# Patient Record
Sex: Female | Born: 1965
Health system: Southern US, Community
[De-identification: ages and names within clinical notes are randomized; demographics above are authoritative.]

## PROBLEM LIST (undated history)

## (undated) DIAGNOSIS — N200 Calculus of kidney: Secondary | ICD-10-CM

---

## 2009-10-29 ENCOUNTER — Emergency Department (HOSPITAL_COMMUNITY): Admission: EM | Admit: 2009-10-29 | Discharge: 2009-10-29 | Payer: Self-pay | Admitting: Emergency Medicine

## 2011-03-23 LAB — URINALYSIS, ROUTINE W REFLEX MICROSCOPIC
Bilirubin Urine: NEGATIVE
Glucose, UA: NEGATIVE mg/dL
Ketones, ur: NEGATIVE mg/dL
Nitrite: NEGATIVE
Protein, ur: NEGATIVE mg/dL
Specific Gravity, Urine: 1.029 (ref 1.005–1.030)
Urobilinogen, UA: 0.2 mg/dL (ref 0.0–1.0)
pH: 5.5 (ref 5.0–8.0)

## 2011-03-23 LAB — URINE MICROSCOPIC-ADD ON

## 2011-03-23 LAB — COMPREHENSIVE METABOLIC PANEL
ALT: 15 U/L (ref 0–35)
AST: 17 U/L (ref 0–37)
Albumin: 4 g/dL (ref 3.5–5.2)
Calcium: 9.3 mg/dL (ref 8.4–10.5)
Chloride: 105 mEq/L (ref 96–112)
Creatinine, Ser: 0.92 mg/dL (ref 0.4–1.2)
GFR calc Af Amer: >60 mL/min (ref >60)
Sodium: 137 mEq/L (ref 135–145)
Total Bilirubin: 0.6 mg/dL (ref 0.3–1.2)

## 2011-03-23 LAB — DIFFERENTIAL
Basophils Absolute: 0 K/uL (ref 0.0–0.1)
Basophils Relative: 1 % (ref 0–1)
Eosinophils Absolute: 0.1 K/uL (ref 0.0–0.7)
Eosinophils Relative: 2 % (ref 0–5)
Lymphocytes Relative: 20 % (ref 12–46)
Lymphs Abs: 1.4 K/uL (ref 0.7–4.0)
Monocytes Absolute: 0.1 K/uL (ref 0.1–1.0)
Monocytes Relative: 2 % — ABNORMAL LOW (ref 3–12)
Neutro Abs: 5.1 K/uL (ref 1.7–7.7)
Neutrophils Relative %: 76 % (ref 43–77)

## 2011-03-23 LAB — RAPID URINE DRUG SCREEN, HOSP PERFORMED
Amphetamines: NOT DETECTED
Barbiturates: NOT DETECTED
Benzodiazepines: NOT DETECTED
Cocaine: NOT DETECTED
Opiates: NOT DETECTED
Tetrahydrocannabinol: POSITIVE — AB

## 2011-03-23 LAB — CBC
HCT: 37 % (ref 36.0–46.0)
Platelets: 270 10*3/uL (ref 150–400)
RBC: 3.92 MIL/uL (ref 3.87–5.11)
WBC: 6.8 10*3/uL (ref 4.0–10.5)

## 2011-03-23 LAB — CK TOTAL AND CKMB (NOT AT ARMC)
CK, MB: 1.1 ng/mL (ref 0.3–4.0)
Relative Index: 1 (ref 0.0–2.5)
Total CK: 105 U/L (ref 7–177)

## 2011-03-23 LAB — TROPONIN I

## 2012-10-30 ENCOUNTER — Emergency Department (INDEPENDENT_AMBULATORY_CARE_PROVIDER_SITE_OTHER)
Admission: EM | Admit: 2012-10-30 | Discharge: 2012-10-30 | Disposition: A | Discharge status: Hospice | Payer: 59 | Source: Home / Self Care | Attending: Emergency Medicine | Admitting: Emergency Medicine

## 2012-10-30 ENCOUNTER — Encounter (HOSPITAL_COMMUNITY): Payer: Self-pay | Admitting: Emergency Medicine

## 2012-10-30 ENCOUNTER — Observation Stay (HOSPITAL_COMMUNITY): Payer: Self-pay

## 2012-10-30 ENCOUNTER — Observation Stay (HOSPITAL_COMMUNITY)
Admission: EM | Admit: 2012-10-30 | Discharge: 2012-10-31 | Disposition: A | Discharge status: Home / Self Care | Payer: 59 | Attending: Emergency Medicine | Admitting: Emergency Medicine

## 2012-10-30 DIAGNOSIS — R200 Anesthesia of skin: Secondary | ICD-10-CM

## 2012-10-30 DIAGNOSIS — R209 Unspecified disturbances of skin sensation: Principal | ICD-10-CM | POA: Insufficient documentation

## 2012-10-30 DIAGNOSIS — F17200 Nicotine dependence, unspecified, uncomplicated: Secondary | ICD-10-CM | POA: Insufficient documentation

## 2012-10-30 DIAGNOSIS — R202 Paresthesia of skin: Secondary | ICD-10-CM

## 2012-10-30 LAB — CBC
MCHC: 34.7 g/dL (ref 30.0–36.0)
RDW: 13.2 % (ref 11.5–15.5)
WBC: 6 10*3/uL (ref 4.0–10.5)

## 2012-10-30 LAB — POCT URINALYSIS DIP (DEVICE)
Ketones, ur: NEGATIVE mg/dL
Protein, ur: NEGATIVE mg/dL
Specific Gravity, Urine: <=1.005 (ref 1.005–1.030)
pH: 7 (ref 5.0–8.0)

## 2012-10-30 MED ORDER — DIAZEPAM 5 MG/ML IJ SOLN
5.0000 mg | Freq: Three times a day (TID) | INTRAMUSCULAR | Status: DC | PRN
  Administered 2012-10-31: 5 mg via INTRAVENOUS
  Filled 2012-10-30: qty 2

## 2012-10-30 NOTE — ED Provider Notes (Signed)
History     CSN: [id number]  Arrival date & time 10/30/12  2027   First MD Initiated Contact with Patient 10/30/12 2254      Chief Complaint  Patient presents with  . Numbness    (Consider location/radiation/quality/duration/timing/severity/associated sxs/prior treatment) HPI Hx per PT. L sided numbness onset 4pm lasted about 2 hours. HAs had this before. Today had to leave work for these symptoms, some associated HA. Took ibuprofen, became more worried tonight and presents for evaluation. No F/C, no neck pain.  No weakness, no trouble with gait or speech. No h/o MS. No recent illness. No known agrevating or alleviating factors. HAs been told her BP was elevated in the past but has never been treated. No h/o HLD or DM. No PCP. Went to UC and sent here for further evaluation.   History reviewed. No pertinent past medical history.  Past Surgical History  Procedure Date  . Cesarean section     Family History  Problem Relation Age of Onset  . Stroke Mother     History  Substance Use Topics  . Smoking status: Current Some Day Smoker  . Smokeless tobacco: Not on file  . Alcohol Use: Yes    OB History    Grav Para Term Preterm Abortions TAB SAB Ect Mult Living                  Review of Systems  Constitutional: Negative for fever and chills.  HENT: Negative for neck pain and neck stiffness.   Eyes: Negative for pain.  Respiratory: Negative for shortness of breath.   Cardiovascular: Negative for chest pain.  Gastrointestinal: Negative for vomiting and abdominal pain.  Genitourinary: Negative for dysuria.  Musculoskeletal: Negative for back pain.  Skin: Negative for rash.  Neurological: Positive for numbness. Negative for dizziness.  All other systems reviewed and are negative.    Allergies  Review of patient's allergies indicates no known allergies.  Home Medications   Current Outpatient Rx  Name  Route  Sig  Dispense  Refill  . VICODIN PO   Oral   Take 0.5  tablets by mouth once. For kidney stone pain         . IBUPROFEN 200 MG PO TABS   Oral   Take 400 mg by mouth every 6 (six) hours as needed. For pain           BP 155/89  Pulse 75  Temp 98.2 F (36.8 C) (Oral)  Resp 18  SpO2 100%  LMP 10/22/2012  Physical Exam  Constitutional: She is oriented to person, place, and time. She appears well-developed and well-nourished.  HENT:  Head: Normocephalic and atraumatic.  Eyes: Conjunctivae normal and EOM are normal. Pupils are equal, round, and reactive to light.  Neck: Full passive range of motion without pain. Neck supple. No thyromegaly present.       No meningismus, no cervical spine tenderness  Cardiovascular: Normal rate, regular rhythm, S1 normal, S2 normal and intact distal pulses.   Pulmonary/Chest: Effort normal and breath sounds normal.  Abdominal: Soft. Bowel sounds are normal. There is no tenderness. There is no CVA tenderness.  Musculoskeletal: Normal range of motion.  Neurological: She is alert and oriented to person, place, and time. She has normal strength and normal reflexes. No cranial nerve deficit or sensory deficit. She displays a negative Romberg sign. GCS eye subscore is 4. GCS verbal subscore is 5. GCS motor subscore is 6.       Normal  Gait  Skin: Skin is warm and dry. No rash noted. No cyanosis. Nails show no clubbing.  Psychiatric: She has a normal mood and affect. Her speech is normal and behavior is normal.    ED Course  Procedures (including critical care time)   ABCD2 score = 3 for BP and duration of symptoms   Date: 10/30/2012  Rate: 66  Rhythm: normal sinus rhythm  QRS Axis: normal  Intervals: normal  ST/T Wave abnormalities: nonspecific ST changes  Conduction Disutrbances:none  Narrative Interpretation:   Old EKG Reviewed: none available  Results for orders placed during the hospital encounter of 10/30/12  CBC      Component Value Range   WBC 6.0  4.0 - 10.5 K/uL   RBC 3.82 (*) 3.87 -  5.11 MIL/uL   Hemoglobin 11.7 (*) 12.0 - 15.0 g/dL   HCT 16.1 (*) 09.6 - 04.5 %   MCV 88.2  78.0 - 100.0 fL   MCH 30.6  26.0 - 34.0 pg   MCHC 34.7  30.0 - 36.0 g/dL   RDW 40.9  81.1 - 91.4 %   Platelets 267  150 - 400 K/uL  COMPREHENSIVE METABOLIC PANEL      Component Value Range   Sodium 135  135 - 145 mEq/L   Potassium 3.6  3.5 - 5.1 mEq/L   Chloride 102  96 - 112 mEq/L   CO2 22  19 - 32 mEq/L   Glucose, Bld 91  70 - 99 mg/dL   BUN 4 (*) 6 - 23 mg/dL   Creatinine, Ser 7.82  0.50 - 1.10 mg/dL   Calcium 9.1  8.4 - 95.6 mg/dL   Total Protein 7.0  6.0 - 8.3 g/dL   Albumin 3.6  3.5 - 5.2 g/dL   AST 17  0 - 37 U/L   ALT 10  0 - 35 U/L   Alkaline Phosphatase 58  39 - 117 U/L   Total Bilirubin 0.3  0.3 - 1.2 mg/dL   GFR calc non Af Amer >90  >90 mL/min   GFR calc Af Amer >90  >90 mL/min  PROTIME-INR      Component Value Range   Prothrombin Time 13.7  11.6 - 15.2 seconds   INR 1.06  0.00 - 1.49  APTT      Component Value Range   aPTT 33  24 - 37 seconds  URINALYSIS, ROUTINE W REFLEX MICROSCOPIC      Component Value Range   Color, Urine YELLOW  YELLOW   APPearance CLEAR  CLEAR   Specific Gravity, Urine 1.004 (*) 1.005 - 1.030   pH 6.5  5.0 - 8.0   Glucose, UA NEGATIVE  NEGATIVE mg/dL   Hgb urine dipstick TRACE (*) NEGATIVE   Bilirubin Urine NEGATIVE  NEGATIVE   Ketones, ur 15 (*) NEGATIVE mg/dL   Protein, ur NEGATIVE  NEGATIVE mg/dL   Urobilinogen, UA 0.2  0.0 - 1.0 mg/dL   Nitrite NEGATIVE  NEGATIVE   Leukocytes, UA NEGATIVE  NEGATIVE  LIPID PANEL      Component Value Range   Cholesterol 178  0 - 200 mg/dL   Triglycerides 79  <213 mg/dL   HDL 55  >08 mg/dL   Total CHOL/HDL Ratio 3.2     VLDL 16  0 - 40 mg/dL   LDL Cholesterol 657 (*) 0 - 99 mg/dL  URINE MICROSCOPIC-ADD ON      Component Value Range   Squamous Epithelial / LPF RARE  RARE  WBC, UA 0-2  <3 WBC/hpf   RBC / HPF 0-2  <3 RBC/hpf   Ct Head Wo Contrast  10/31/2012  *RADIOLOGY REPORT*  Clinical Data:  Left-sided paralysis.  CT HEAD WITHOUT CONTRAST  Technique:  Contiguous axial images were obtained from the base of the skull through the vertex without contrast.  Comparison: 10/29/2009.  Findings: No acute intracranial abnormality.  Specifically, no hemorrhage, hydrocephalus, mass lesion, acute infarction, or significant intracranial injury.  No acute calvarial abnormality. Visualized paranasal sinuses and mastoids clear.  Orbital soft tissues unremarkable.  IMPRESSION: Unremarkable study.   Original Report Authenticated By: Charlett Nose, M.D.     PO ASA  MDM   L sided numbness now resolved.   Plan TIA OBS protocol MRI in am        Sunnie Nielsen, MD 10/31/12 [phone]

## 2012-10-30 NOTE — ED Notes (Signed)
Pt reports for having L sided numbness for months that has been on and off, started to have episode today, reports felt like had back spasms earlier, and then has numbness to face, reports now has resolved; pt speech clear, no drift, face symmetrical, a&ox4

## 2012-10-30 NOTE — ED Provider Notes (Addendum)
He is an or History     CSN: [id number]  Arrival date & time 10/30/12  1925   First MD Initiated Contact with Patient 10/30/12 1931      Chief Complaint  Patient presents with  . Numbness    (Consider location/radiation/quality/duration/timing/severity/associated sxs/prior treatment) HPI Comments: Patient presents this evening to urgent care complaining of left-sided numbness intermittently that last several hours at a time, she describes that she is unable to feel the left side of her face, left upper extremity  all way down to her left leg. She has been experiencing these symptoms on and off for many months, but have been worsening for the last several weeks.Within the last 2 days she's been also having back pain (LOW) now taking some ibuprofen and some other medication that did not help. She denies any nausea, vomiting, headache visual changes.  Patient is a 46 y.o. female presenting with neurologic complaint. The history is provided by the patient.  Neurologic Problem The primary symptoms include loss of sensation. Primary symptoms do not include headaches, loss of consciousness, altered mental status, seizures, dizziness, visual change, speech change, fever, nausea or vomiting. The symptoms began more than 1 week ago. The symptoms are waxing and waning. The neurological symptoms are focal.  Additional symptoms include pain. Additional symptoms do not include neck stiffness, weakness, lower back pain, leg pain, loss of balance, taste disturbance, hyperacusis or anxiety. Workup history includes CT scan. Procedure history comments: 2010 negative CT.    History reviewed. No pertinent past medical history.  Past Surgical History  Procedure Date  . Cesarean section     Family History  Problem Relation Age of Onset  . Stroke Mother     History  Substance Use Topics  . Smoking status: Current Some Day Smoker  . Smokeless tobacco: Not on file  . Alcohol Use: Yes    OB History      Grav Para Term Preterm Abortions TAB SAB Ect Mult Living                  Review of Systems  Constitutional: Negative for fever, chills and appetite change.  HENT: Negative for neck stiffness.   Gastrointestinal: Negative for nausea and vomiting.  Musculoskeletal: Negative for back pain, joint swelling, arthralgias and gait problem.  Skin: Negative for rash.  Neurological: Positive for numbness. Negative for dizziness, tremors, speech change, seizures, loss of consciousness, syncope, facial asymmetry, speech difficulty, weakness, headaches and loss of balance.  Psychiatric/Behavioral: Negative for altered mental status.    Allergies  Review of patient's allergies indicates no known allergies.  Home Medications   Current Outpatient Rx  Name  Route  Sig  Dispense  Refill  . VICODIN PO   Oral   Take 0.5 tablets by mouth once. For kidney stone pain         . IBUPROFEN 200 MG PO TABS   Oral   Take 400 mg by mouth every 6 (six) hours as needed. For pain           BP 147/66  Pulse 73  Temp 97.9 F (36.6 C) (Oral)  Resp 18  SpO2 100%  LMP 10/22/2012  Physical Exam  Nursing note and vitals reviewed. Constitutional: She is oriented to person, place, and time. Vital signs are normal. She appears well-developed and well-nourished.  Non-toxic appearance. She does not have a sickly appearance. She does not appear ill. No distress.  Cardiovascular: Normal rate and regular rhythm.  Exam reveals  no gallop and no friction rub.   No murmur heard. Pulmonary/Chest: Effort normal and breath sounds normal.  Neurological: She is alert and oriented to person, place, and time. She has normal strength. She displays no atrophy, no tremor and normal reflexes. A sensory deficit is present. No cranial nerve deficit. She exhibits normal muscle tone. She displays no seizure activity. Coordination and gait normal. GCS eye subscore is 4. GCS verbal subscore is 5. GCS motor subscore is 6.        Patient on sensorial exam seem to be having problems discriminate in tactile sensation on her left forehead area. No obvious signs of facial motor para ounces is seen on exam. Muscular strength of both upper extremities 5 out of 5.  Skin: Skin is warm. No erythema.    ED Course  Procedures (including critical care time)  Labs Reviewed  POCT URINALYSIS DIP (DEVICE) - Abnormal; Notable for the following:    Hgb urine dipstick TRACE (*)     All other components within normal limits  LAB REPORT - SCANNED   Ct Head Wo Contrast  10/31/2012  *RADIOLOGY REPORT*  Clinical Data: Left-sided paralysis.  CT HEAD WITHOUT CONTRAST  Technique:  Contiguous axial images were obtained from the base of the skull through the vertex without contrast.  Comparison: 10/29/2009.  Findings: No acute intracranial abnormality.  Specifically, no hemorrhage, hydrocephalus, mass lesion, acute infarction, or significant intracranial injury.  No acute calvarial abnormality. Visualized paranasal sinuses and mastoids clear.  Orbital soft tissues unremarkable.  IMPRESSION: Unremarkable study.   Original Report Authenticated By: Charlett Nose, M.D.    Mr Brain Wo Contrast  10/31/2012  *RADIOLOGY REPORT*  Clinical Data: Left sided numbness.  Headaches.  MRI HEAD WITHOUT CONTRAST  Technique:  Multiplanar, multiecho pulse sequences of the brain and surrounding structures were obtained according to standard protocol without intravenous contrast.  Comparison: 10/30/2012 and 10/29/2009 CT.  10/29/2009 MR.  Findings: No acute infarct.  No intracranial hemorrhage.  No intracranial mass lesion detected on this unenhanced exam.  No hydrocephalus.  Scattered nonspecific white matter type changes appears similar to the prior examination.  Findings may be related to result of small vessel disease although other considerations such as that secondary to; demyelinating process, vasculitis, inflammatory process, migraine headaches or remote trauma  cannot be completely excluded.  Small although patent right vertebral artery.  This is without change.  Major intracranial vascular structures are patent. Mild ectasia of the left internal carotid artery supraclinoid segment and basilar artery.  Minimal to mild paranasal sinus mucosal thickening.  Cerebellar tonsils minimally low-lying but within range normal limits. Minimal exophthalmos.  IMPRESSION: No acute infarct.  No significant change in the appearance of nonspecific white matter type changes as detailed above.  Minimal to mild paranasal sinus mucosal thickening.   Original Report Authenticated By: Lacy Duverney, M.D.      1. Paresthesia       MDM  Recurrent unilateral paresthesias for months- Patient will be transferred to the ED for furtther evaluation.        Jimmie Molly, MD 10/30/12 2010  Jimmie Molly, MD 10/31/12 1423  Jimmie Molly, MD 10/31/12 [phone]

## 2012-10-30 NOTE — ED Notes (Signed)
Reports left side paralysis for months now on and off.   Patient says it started with back pain.  Patient states she took ibuprofen and another pill that was given to her by a friend and when the medication wore off that's when she reports the left side of her body is numbness.

## 2012-10-30 NOTE — ED Notes (Signed)
Pt with intermittent L sided numbness for several years that is getting worse.  Sometimes it is just her L arm, but other times it is her face, arm and leg. NIH of 0.

## 2012-10-31 ENCOUNTER — Encounter (HOSPITAL_COMMUNITY): Payer: Self-pay | Admitting: Radiology

## 2012-10-31 ENCOUNTER — Observation Stay (HOSPITAL_COMMUNITY): Payer: Self-pay

## 2012-10-31 DIAGNOSIS — G459 Transient cerebral ischemic attack, unspecified: Secondary | ICD-10-CM

## 2012-10-31 LAB — COMPREHENSIVE METABOLIC PANEL
ALT: 10 U/L (ref 0–35)
AST: 17 U/L (ref 0–37)
Albumin: 3.6 g/dL (ref 3.5–5.2)
Alkaline Phosphatase: 58 U/L (ref 39–117)
Chloride: 102 mEq/L (ref 96–112)
Potassium: 3.6 mEq/L (ref 3.5–5.1)
Sodium: 135 mEq/L (ref 135–145)
Total Bilirubin: 0.3 mg/dL (ref 0.3–1.2)
Total Protein: 7 g/dL (ref 6.0–8.3)

## 2012-10-31 LAB — LIPID PANEL
Cholesterol: 178 mg/dL (ref 0–200)
HDL: 55 mg/dL (ref >39)
Total CHOL/HDL Ratio: 3.2 RATIO
Triglycerides: 79 mg/dL (ref <150)

## 2012-10-31 LAB — POCT I-STAT TROPONIN I: Troponin i, poc: 0 ng/mL (ref 0.00–0.08)

## 2012-10-31 LAB — URINALYSIS, ROUTINE W REFLEX MICROSCOPIC
Glucose, UA: NEGATIVE mg/dL
Leukocytes, UA: NEGATIVE
Protein, ur: NEGATIVE mg/dL
Specific Gravity, Urine: 1.004 — ABNORMAL LOW (ref 1.005–1.030)
pH: 6.5 (ref 5.0–8.0)

## 2012-10-31 LAB — HEMOGLOBIN A1C: Mean Plasma Glucose: 97 mg/dL (ref <117)

## 2012-10-31 MED ORDER — ASPIRIN 81 MG PO CHEW
324.0000 mg | CHEWABLE_TABLET | Freq: Once | ORAL | Status: AC
  Administered 2012-10-31: 324 mg via ORAL
  Filled 2012-10-31: qty 4

## 2012-10-31 NOTE — ED Notes (Signed)
PT is medicated with valium and going to MRI now

## 2012-10-31 NOTE — ED Provider Notes (Signed)
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 Patient was signed out to me by Dr. Dierdre Highman.  Denies headache, chest pain, shortness of breath, nausea, vomiting, abdominal pain, constipation, diarrhea, dysuria, back pain, peripheral edema, numbness and tingling of the extremities.  PE: Gen: A&O x4 HEENT: PERRL, EOM CHEST: RRR, no m/r/g LUNGS: CTAB, no w/r/r ABD: BS x 4, ND/NT EXT: No edema, strong peripheral pulses NEURO: Sensation and strength intact bilaterally  Plan: Obtain MRI and carotid Doppler, discharge or normal.  11:08 AM I have discussed this patient with Dr. Bebe Shaggy, who has reviewed the carotid Doppler and echo results, which are negative for acute process. He tells me that the patient may be discharged with primary care followup. Patient is agreeable with this. Patient is stable and ready for discharge.  Roxy Horseman, PA-C 10/31/12 1109

## 2012-10-31 NOTE — Progress Notes (Signed)
VASCULAR LAB PRELIMINARY  PRELIMINARY  PRELIMINARY  PRELIMINARY  Carotid duplex  completed.    Preliminary report:  Bilateral:  No evidence of hemodynamically significant internal carotid artery stenosis.   Vertebral artery flow is antegrade.      Galya Dunnigan, RVT 10/31/2012, 8:42 AM

## 2012-10-31 NOTE — ED Notes (Signed)
Pt back from MRI and talking to family on the phone

## 2012-10-31 NOTE — Progress Notes (Signed)
  Echocardiogram 2D Echocardiogram has been performed.  Sarah Hunter 10/31/2012, 9:01 AM

## 2012-10-31 NOTE — Progress Notes (Signed)
Utilization review completed.  

## 2012-11-02 NOTE — ED Provider Notes (Signed)
Medical screening examination/treatment/procedure(s) were performed by non-physician practitioner and as supervising physician I was immediately available for consultation/collaboration.   Joya Gaskins, MD 11/02/12 [phone]

## 2013-05-10 ENCOUNTER — Encounter (HOSPITAL_COMMUNITY): Payer: Self-pay | Admitting: *Deleted

## 2013-05-10 ENCOUNTER — Emergency Department (HOSPITAL_COMMUNITY)
Admission: EM | Admit: 2013-05-10 | Discharge: 2013-05-10 | Disposition: A | Discharge status: Home / Self Care | Payer: Self-pay | Attending: Emergency Medicine | Admitting: Emergency Medicine

## 2013-05-10 DIAGNOSIS — M7989 Other specified soft tissue disorders: Secondary | ICD-10-CM | POA: Insufficient documentation

## 2013-05-10 DIAGNOSIS — L539 Erythematous condition, unspecified: Secondary | ICD-10-CM | POA: Insufficient documentation

## 2013-05-10 DIAGNOSIS — L03119 Cellulitis of unspecified part of limb: Secondary | ICD-10-CM

## 2013-05-10 DIAGNOSIS — F17200 Nicotine dependence, unspecified, uncomplicated: Secondary | ICD-10-CM | POA: Insufficient documentation

## 2013-05-10 DIAGNOSIS — L02519 Cutaneous abscess of unspecified hand: Secondary | ICD-10-CM | POA: Insufficient documentation

## 2013-05-10 LAB — CBC WITH DIFFERENTIAL/PLATELET
HCT: 36.8 % (ref 36.0–46.0)
Hemoglobin: 12.3 g/dL (ref 12.0–15.0)
Lymphocytes Relative: 28 % (ref 12–46)
Lymphs Abs: 1.7 10*3/uL (ref 0.7–4.0)
Monocytes Relative: 10 % (ref 3–12)
Neutro Abs: 3.7 10*3/uL (ref 1.7–7.7)
Neutrophils Relative %: 60 % (ref 43–77)
RBC: 4.03 MIL/uL (ref 3.87–5.11)

## 2013-05-10 LAB — BASIC METABOLIC PANEL
BUN: 14 mg/dL (ref 6–23)
CO2: 20 mEq/L (ref 19–32)
Chloride: 102 mEq/L (ref 96–112)
GFR calc Af Amer: >90 mL/min (ref >90)
Glucose, Bld: 76 mg/dL (ref 70–99)
Potassium: 3.7 mEq/L (ref 3.5–5.1)

## 2013-05-10 MED ORDER — CEPHALEXIN 500 MG PO CAPS: 1000.0000 mg | ORAL_CAPSULE | Freq: Four times a day (QID) | ORAL | Status: DC

## 2013-05-10 MED ORDER — TRAMADOL HCL 50 MG PO TABS: 50.0000 mg | ORAL_TABLET | Freq: Four times a day (QID) | ORAL | Status: DC | PRN

## 2013-05-10 MED ORDER — CEPHALEXIN 250 MG PO CAPS
1000.0000 mg | ORAL_CAPSULE | Freq: Once | ORAL | Status: AC
  Administered 2013-05-10: 1000 mg via ORAL
  Filled 2013-05-10: qty 4

## 2013-05-10 NOTE — ED Provider Notes (Signed)
Medical screening examination/treatment/procedure(s) were conducted as a shared visit with non-physician practitioner(s) and myself.  I personally evaluated the patient during the encounter.  This 47 year old female has localized right dorsal hand cellulitis with ascending lymphangitis without history of immunocompromised state the patient appears nontoxic I doubt abscess or necrotizing fasciitis or sepsis I believe outpatient treatment is reasonable as does the patient.  Hurman Horn, MD 05/13/13 1728

## 2013-05-10 NOTE — ED Provider Notes (Signed)
History    This chart was scribed for a non-physician practitioner, Wynetta Emery, working with Hurman Horn, MD by Frederik Pear, ED Scribe. This patient was seen in room TR04C/TR04C and the patient's care was started at 1745.   CSN: [id number]  Arrival date & time 05/10/13  1511   First MD Initiated Contact with Patient 05/10/13 1745      Chief Complaint  Patient presents with  . Hand Pain    (Consider location/radiation/quality/duration/timing/severity/associated sxs/prior treatment) The history is provided by the patient and medical records. No language interpreter was used.    HPI Comments: Sarah Hunter is a 47 y.o. female who presents to the Emergency Department complaining of constant, worsening 6/10 right hand pain with associated gradually worsening swelling, redness, and streaking up the arm that initially began with itching yesterday at 1730. She reports that she initially thought the itching was from a bug bite, but denies seeing any bugs near her skin. She denies fever, nausea, and emesis. No allergies to medications. No history of diabetes, HIV, chronic steroid use.   History reviewed. No pertinent past medical history.  Past Surgical History  Procedure Laterality Date  . Cesarean section      Family History  Problem Relation Age of Onset  . Stroke Mother     History  Substance Use Topics  . Smoking status: Current Some Day Smoker  . Smokeless tobacco: Not on file  . Alcohol Use: Yes    OB History   Grav Para Term Preterm Abortions TAB SAB Ect Mult Living                  Review of Systems  Constitutional: Negative for fever.  Respiratory: Negative for shortness of breath.   Cardiovascular: Negative for chest pain.  Gastrointestinal: Negative for nausea, vomiting, abdominal pain and diarrhea.  Musculoskeletal:       Hand pain  Skin: Positive for color change.       swelling  All other systems reviewed and are negative.   Allergies  Iodine  and Shrimp  Home Medications  No current outpatient prescriptions on file.  BP 143/84  Pulse 65  Temp(Src) 98.6 F (37 C) (Oral)  Resp 22  SpO2 99%  LMP 04/19/2013  Physical Exam  Nursing note and vitals reviewed. Constitutional: She is oriented to person, place, and time. She appears well-developed and well-nourished. No distress.  HENT:  Head: Normocephalic.  Eyes: Conjunctivae and EOM are normal.  Cardiovascular: Normal rate.   Pulmonary/Chest: Effort normal. No stridor.  Musculoskeletal: Normal range of motion.  Neurological: She is alert and oriented to person, place, and time.  Skin: Skin is warm. There is erythema.  Non-circumferential cellulitis to dorsum of right hand sparing the fingers with streaking up the right arm. Moderate swelling, erythema, warmth, and tenderness to palpation. Pinpoint punction wound to center of the right hand.  Psychiatric: She has a normal mood and affect.       ED Course  Procedures (including critical care time)  DIAGNOSTIC STUDIES: Oxygen Saturation is 99% on room air, normal by my interpretation.    COORDINATION OF CARE:  18:27- Discussed planned course of treatment with the patient, including blood work, who is agreeable at this time.  Results for orders placed during the hospital encounter of 05/10/13  CBC WITH DIFFERENTIAL      Result Value Range   WBC 6.2  4.0 - 10.5 K/uL   RBC 4.03  3.87 - 5.11 MIL/uL  Hemoglobin 12.3  12.0 - 15.0 g/dL   HCT 16.1  09.6 - 04.5 %   MCV 91.3  78.0 - 100.0 fL   MCH 30.5  26.0 - 34.0 pg   MCHC 33.4  30.0 - 36.0 g/dL   RDW 40.9  81.1 - 91.4 %   Platelets 270  150 - 400 K/uL   Neutrophils Relative % 60  43 - 77 %   Neutro Abs 3.7  1.7 - 7.7 K/uL   Lymphocytes Relative 28  12 - 46 %   Lymphs Abs 1.7  0.7 - 4.0 K/uL   Monocytes Relative 10  3 - 12 %   Monocytes Absolute 0.6  0.1 - 1.0 K/uL   Eosinophils Relative 2  0 - 5 %   Eosinophils Absolute 0.1  0.0 - 0.7 K/uL   Basophils Relative  0  0 - 1 %   Basophils Absolute 0.0  0.0 - 0.1 K/uL  BASIC METABOLIC PANEL      Result Value Range   Sodium 135  135 - 145 mEq/L   Potassium 3.7  3.5 - 5.1 mEq/L   Chloride 102  96 - 112 mEq/L   CO2 20  19 - 32 mEq/L   Glucose, Bld 76  70 - 99 mg/dL   BUN 14  6 - 23 mg/dL   Creatinine, Ser 7.82  0.50 - 1.10 mg/dL   Calcium 9.2  8.4 - 95.6 mg/dL   GFR calc non Af Amer 86 (*) >90 mL/min   GFR calc Af Amer >90  >90 mL/min    Labs Reviewed  BASIC METABOLIC PANEL - Abnormal; Notable for the following:    GFR calc non Af Amer 86 (*)    All other components within normal limits  CBC WITH DIFFERENTIAL   No results found.   1. Cellulitis of hand excluding fingers       MDM   Sarah Hunter is a 47 y.o. female with non-circumferential cellulitis to right hand sparing the fingers. Patient will be given Keflex. Affected area outlined. Return precautions given.  This is a shared visit with the attending physician who personally evaluated the patient and agrees with the care plan.    Filed Vitals:   05/10/13 1525 05/10/13 1840  BP: 143/84 145/81  Pulse: 65 60  Temp: 98.6 F (37 C)   TempSrc: Oral   Resp: 22 18  SpO2: 99% 100%     Pt verbalized understanding and agrees with care plan. Outpatient follow-up and return precautions given.    Discharge Medication List as of 05/10/2013  7:01 PM    START taking these medications   Details  cephALEXin (KEFLEX) 500 MG capsule Take 2 capsules (1,000 mg total) by mouth 4 (four) times daily., Starting 05/10/2013, Until Discontinued, Print    traMADol (ULTRAM) 50 MG tablet Take 1 tablet (50 mg total) by mouth every 6 (six) hours as needed for pain., Starting 05/10/2013, Until Discontinued, Print        I personally performed the services described in this documentation, which was scribed in my presence. The recorded information has been reviewed and is accurate.        Wynetta Emery, PA-C 05/11/13 0010

## 2013-05-10 NOTE — ED Notes (Signed)
Dr. Fonnie Jarvis and PA at bedside.

## 2013-05-10 NOTE — ED Notes (Signed)
Reports possible bug bite to right hand that occurred yesterday. Noticed itching to hand after work yesterday. Woke up today with redness, swelling and pain to right hand. Redness is spreading up her right arm.

## 2014-09-29 ENCOUNTER — Emergency Department (HOSPITAL_COMMUNITY): Payer: Self-pay

## 2014-09-29 ENCOUNTER — Emergency Department (HOSPITAL_COMMUNITY)
Admission: EM | Admit: 2014-09-29 | Discharge: 2014-09-29 | Disposition: A | Discharge status: Home / Self Care | Payer: Self-pay | Attending: Emergency Medicine | Admitting: Emergency Medicine

## 2014-09-29 ENCOUNTER — Encounter (HOSPITAL_COMMUNITY): Payer: Self-pay | Admitting: Emergency Medicine

## 2014-09-29 ENCOUNTER — Other Ambulatory Visit: Payer: Self-pay

## 2014-09-29 DIAGNOSIS — Z720 Tobacco use: Secondary | ICD-10-CM | POA: Insufficient documentation

## 2014-09-29 DIAGNOSIS — Z7982 Long term (current) use of aspirin: Secondary | ICD-10-CM | POA: Insufficient documentation

## 2014-09-29 DIAGNOSIS — Z3202 Encounter for pregnancy test, result negative: Secondary | ICD-10-CM | POA: Insufficient documentation

## 2014-09-29 DIAGNOSIS — R079 Chest pain, unspecified: Secondary | ICD-10-CM | POA: Insufficient documentation

## 2014-09-29 DIAGNOSIS — R200 Anesthesia of skin: Secondary | ICD-10-CM | POA: Insufficient documentation

## 2014-09-29 LAB — BASIC METABOLIC PANEL
ANION GAP: 14 (ref 5–15)
BUN: 11 mg/dL (ref 6–23)
CALCIUM: 9.3 mg/dL (ref 8.4–10.5)
CHLORIDE: 98 meq/L (ref 96–112)
CO2: 23 meq/L (ref 19–32)
CREATININE: 0.9 mg/dL (ref 0.50–1.10)
GFR calc non Af Amer: 75 mL/min — ABNORMAL LOW (ref >90)
GFR, EST AFRICAN AMERICAN: 87 mL/min — AB (ref >90)
Glucose, Bld: 86 mg/dL (ref 70–99)
Potassium: 4.1 mEq/L (ref 3.7–5.3)
SODIUM: 135 meq/L — AB (ref 137–147)

## 2014-09-29 LAB — CBC
HCT: 40.1 % (ref 36.0–46.0)
Hemoglobin: 13.8 g/dL (ref 12.0–15.0)
MCH: 31.8 pg (ref 26.0–34.0)
MCHC: 34.4 g/dL (ref 30.0–36.0)
MCV: 92.4 fL (ref 78.0–100.0)
PLATELETS: 271 10*3/uL (ref 150–400)
RBC: 4.34 MIL/uL (ref 3.87–5.11)
RDW: 13.5 % (ref 11.5–15.5)
WBC: 4.6 10*3/uL (ref 4.0–10.5)

## 2014-09-29 LAB — I-STAT TROPONIN, ED: TROPONIN I, POC: 0 ng/mL (ref 0.00–0.08)

## 2014-09-29 LAB — POC URINE PREG, ED: Preg Test, Ur: NEGATIVE

## 2014-09-29 MED ORDER — LORAZEPAM 2 MG/ML IJ SOLN
1.0000 mg | Freq: Once | INTRAMUSCULAR | Status: AC
  Administered 2014-09-29: 1 mg via INTRAVENOUS
  Filled 2014-09-29: qty 1

## 2014-09-29 NOTE — ED Notes (Signed)
X 1 week of chest tightness, heaviness in left shoulder, left arm, left leg and sometimes back of neck.  Been taking baby aspirin. Been belching a lot, "feels like i have a lot of air in my stomach/under chest."

## 2014-09-29 NOTE — ED Notes (Signed)
Pt monitored by pulse ox, bp cuff, and 12-lead. 

## 2014-09-29 NOTE — Discharge Instructions (Signed)
Call for a follow up appointment with a Family or Primary Care Provider.  °Return if Symptoms worsen.   °Take medication as prescribed.  ° ° °Emergency Department Resource Guide °1) Find a Doctor and Pay Out of Pocket °Although you won't have to find out who is covered by your insurance plan, it is a good idea to ask around and get recommendations. You will then need to call the office and see if the doctor you have chosen will accept you as a new patient and what types of options they offer for patients who are self-pay. Some doctors offer discounts or will set up payment plans for their patients who do not have insurance, but you will need to ask so you aren't surprised when you get to your appointment. ° °2) Contact Your Local Health Department °Not all health departments have doctors that can see patients for sick visits, but many do, so it is worth a call to see if yours does. If you don't know where your local health department is, you can check in your phone book. The CDC also has a tool to help you locate your state's health department, and many state websites also have listings of all of their local health departments. ° °3) Find a Walk-in Clinic °If your illness is not likely to be very severe or complicated, you may want to try a walk in clinic. These are popping up all over the country in pharmacies, drugstores, and shopping centers. They're usually staffed by nurse practitioners or physician assistants that have been trained to treat common illnesses and complaints. They're usually fairly quick and inexpensive. However, if you have serious medical issues or chronic medical problems, these are probably not your best option. ° °No Primary Care Doctor: °- Call [facility] at  832-8000 - they can help you locate a primary care doctor that  accepts your insurance, provides certain services, etc. °- Physician Referral Service- 1-800-533-3463 ° °Chronic Pain Problems: °Organization         Address  Phone    Notes  °[facility]  (336) 297-2271 Patients need to be referred by their primary care doctor.  ° °Medication Assistance: °Organization         Address  Phone   Notes  °[place] [facility] [address] °[place], [place] [address] (336) 641-8030 --Must be a resident of [place] °-- Must have NO insurance coverage whatsoever (no Medicaid/ Medicare, etc.) °-- The pt. MUST have a primary care doctor that directs their care regularly and follows them in the community °  °[facility]  (866) 331-1348   °[employer]  (888) 892-1162   ° °Agencies that provide inexpensive medical care: °Organization         Address  Phone   Notes  °[facility]  (336) 832-8035   °[facility]    (336) 832-7272   °[facility] [address] °[place], [place] [address] (336) 832-4777   °[facility] [address], °[place] (336) 271-4999   °[facility]    (336) 373-0678   °[facility]    (336) 272-1050   °[facility] ° [address], [place] Phone:  (336) 832-4444, Fax:  (336) 832-4440 Hours of Operation:  9 am - 6 pm, M-F.  Also accepts Medicaid/Medicare and self-pay.  °[facility] ° [address], [place]   Phone: (336) 832-3150, Fax: (336) 832-3151. Hours of Operation:  8:30 am - 5:30 pm, M-F.  Also accepts Medicaid and self-pay.  °[facility] [place] [address], [place] Phone: (336) 878-6027   °[facility] [address], [place], [place] (336)723-1848, Ext. 123 Mondays & Thursdays: 7-9 AM.  First 15 patients are seen on a first come, first serve basis. °  ° °Medicaid-accepting [place] Providers: ° °Organization         Address  Phone   Notes  °[facility] [address], [place] (336) 641-2100 Also accepts self-pay patients.  °[facility]  [address], [place] ° (336) 856-9996   °[facility] [address], [place] (336) 288-8857   °[facility] [address] [place] [address], [place] (336) 299-7000   °Veita Bland [address], [place]  ° (336) 373-1557 Only accepts [place] Medicaid patients after they have their name applied to their card.  ° °Self-Pay (no insurance) in [place]: ° °Organization         Address  Phone   Notes  °Sickle Cell Patients, [facility] [address], [place] (336) 832-1970   °[facility] [address], [place] (336) 832-4400   °[facility] ° [address], [place] (336) 992-4800   °[facility]/Dr. Osei-Bonsu ° [address] [place] [address], [place] or [address], [place] (336) 841-8500 Phone number for both [place] and [place] locations is the same.  °[facility] [address], [place] (336) 299-0000   °[facility] [address] [place] [address], [place] or [address] (336) 852-7530 °(336) 878-2260   °[facility] [address], [place] (336) 350-1642, phone; (336) 294-5005, fax Sees patients 1st and 3rd Saturday of every month.  Must not qualify for public or private insurance (i.e. Medicaid, Medicare, [place] Health Choice, Veterans' Benefits) • Household income should be no more than 200% of the poverty level •The clinic cannot treat you if you are pregnant or think you are pregnant • Sexually transmitted diseases are not treated at the clinic.  ° ° °Dental Care: °Organization         Address  Phone  Notes  °[facility] [address], [place] (336) 641-6152 Accepts children up to age 21 who are enrolled in Medicaid or [place] Health Choice; pregnant women with a Medicaid card; and children who have  applied for Medicaid or [place] Health Choice, but were declined, whose parents can pay a reduced fee at time of service.  °[facility] [place]  [address], [place] (336) 641-7733 Accepts children up to age 21 who are enrolled in Medicaid or [place] Health Choice; pregnant women with a Medicaid card; and children who have applied for Medicaid or [place] Health Choice, but were declined, whose parents can pay a reduced fee at time of service.  °[facility] ° [address], [place] (336) 641-4533 Patients are seen by appointment only. Walk-ins are not accepted. [facility] will see patients 18 years of age and older. °Monday - Tuesday (8am-5pm) °Most Wednesdays (8:30-5pm) °$30 per visit, cash only  °[facility] ° [address], [place] (336) 641-4533 Patients are seen by appointment only. Walk-ins are not accepted. [facility] will see patients 18 years of age and older. °One Wednesday Evening (Monthly: Volunteer Based).  $30 per visit, cash only  °[facility]  (919) 537-3737 for adults; Children under age 4, call Graduate Pediatric Dentistry at (919) [phone]. Children aged 4-14, please call (919) [phone] to request a pediatric application. ° Dental services are provided in all areas of dental care including fillings, crowns and bridges, complete and partial dentures, implants, gum treatment, root canals, and extractions. Preventive care is also provided. Treatment is provided to both adults and children. °Patients are selected via a lottery and there is often a waiting list. °  °[facility] [address], °[place] ° (336) 763-8833 [email or web address] °  °[facility] [address], [place], [place] (336)723-1848, Ext. 123 Second and Fourth Thursday of each month, opens at 6:30 AM; Clinic ends at 9 AM.  Patients are seen on a first-come first-served basis, and a  limited number are seen during each clinic.  ° °[facility] ° [address], [place], [place] (336) 723-7904   Eligibility Requirements °You must have lived in [place], [place], or [place] counties for at least the last three months. °  You cannot be eligible for state or federal sponsored healthcare insurance, including Veterans Administration, Medicaid, or Medicare. °  You generally cannot be eligible for healthcare insurance through your employer.  °  How to apply: °Eligibility screenings are held every Tuesday and Wednesday afternoon from 1:00 pm until 4:00 pm. You do not need an appointment for the interview!  °[facility] [address], [place], [place] 336-631-2330   °[facility] [facility]  336-342-8273   °[facility]  336-703-3100   °[facility]  336-570-6415   ° °Behavioral Health Resources in the Community: °Intensive Outpatient Programs °Organization         Address  Phone  Notes  °[facility] [address], [place], [place] 336-878-6098   °[facility] [address], [place], [place] 336-832-9800   °[facility] [address], [place], [place] ° 336-882-2125   °[facility] [address],  °[place], [place] 1-800-853-5163 or 336-641-4981   °Substance Abuse Resources °Organization         Address  Phone  Notes  °[facility]  336-882-2125   °[facility]  336-784-9470   °[facility]  336-285-9073   °[facility]  336-845-3988   °Residential & Outpatient Substance Abuse Program  1-800-659-3381   °Psychological Services °Organization         Address  Phone  Notes  °[facility]  336- 832-9600   °[facility]  336- 378-7881   °[facility] [address], [place] 1-800-853-5163 or 336-641-4981   ° °Mobile Crisis Teams °Organization          Address  Phone  Notes  °[facility], Mobile Crisis Care Unit  1-877-626-1772   °Assertive °[facility] ° [address] [place], [place] 336-834-9664   °Sharon DeEsch [address] °[place] 336-554-5454   ° °Self-Help/Support Groups °Organization         Address  Phone             Notes  °[facility] - variety of   support groups  336- 373-1402 Call for more information  °Narcotics Anonymous (NA), [facility] [address], °[place]  2 meetings at this location  ° °Residential Treatment Programs °Organization         Address  Phone  Notes  °[facility] [address],    °[place]  1-866-801-8205   °[facility] ° [address], [place], [place] 704-293-8524   °[facility] [address], [place] 336-845-3988 Admissions: 8am-3pm M-F  °[facility] [address]    °[place], [place] 336-841-1104   °[facility] [address], [place], [place] 336-379-7146   °[facility] [address]  °[place], [place] 336-285-9073   °[facility] - Intensive Outpatient [address], [place], [place] 336-852-3033   °[facility] [address]  °[place], [place] 1-877-615-2722 or 336-784-9470   °[facility] [address] [place], [place] 336-227-7417 Accepts Medicaid  °[facility] [address]  °[place] 1-800-659-3381 Substance Abuse/Addiction Treatment  ° °[place] Behavioral Health Resources °Organization         Address  Phone  Notes  °[facility]  (888) 581-9988   °Julie Brannon, PhD [address] [place], [place]   (336) 349-5553 or (336) 951-0000   °[facility]   [address] °[place], [place] (336) 349-4454   °[facility] [address], [place], [place] (336) 342-8316 Insurance/Medicaid/sponsorship  through [facility]  °[facility] [address]                                    [place], [place] (336) 342-8316 Therapy/tele-psych/case  °[facility] [address]  ° [place], [place] (336) 349-2233    °Dr. Arfeen  (336) 349-4544   °[facility] [place]  [employer] [place] [facility] 1) [address], [place] °2) [address], [place] °3)  [address], [place] (336) 349-3220 °(336) 342-7768 ° °(336) 342-8140   °[place] [facility] (336) 342-1394 or (336) 342-3537 (After Hours)    ° °

## 2014-09-29 NOTE — ED Notes (Signed)
Lauren , PA-C, is at the bedside 

## 2014-09-29 NOTE — ED Notes (Signed)
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 She will enter order..Marland Kitchen

## 2014-09-29 NOTE — ED Provider Notes (Signed)
CSN: [id number]     Arrival date & time 09/29/14  1446 History   First MD Initiated Contact with Patient 09/29/14 1829     Chief Complaint  Patient presents with  . Chest Pain     (Consider location/radiation/quality/duration/timing/severity/associated sxs/prior Treatment) HPI Comments: The patient is a 48 year old female past history of tobacco abuse presents emergency room chief complaint of chest tightness and left upper treatment he and lower tremor knee. Seizures for 2 weeks. Patient reports chest discomfort as tightness and heaviness, gas and belching. Patient denies shortness of breath, cough.   Denies history of murmur, previous MI, arrythmia, or family history of early MI.  No recent travel, family history or personal history of DVT/PE, lower extremity swelling, smoking, cancer, or exogenous estrogen. The patient also reports intermittent numbness to left upper and lower tremor knee. She also reports burning sensation and upper and lower extremity. She reports upper extremity symptoms relieved with elevation of left upper [place] about head. She denies aggravating factors of lower extremity discomfort denies low back pain history sciatica.   The history is provided by the patient. No language interpreter was used.    History reviewed. No pertinent past medical history. Past Surgical History  Procedure Laterality Date  . Cesarean section     Family History  Problem Relation Age of Onset  . Stroke Mother    History  Substance Use Topics  . Smoking status: Current Some Day Smoker  . Smokeless tobacco: Not on file  . Alcohol Use: Yes   OB History   Grav Para Term Preterm Abortions TAB SAB Ect Mult Living                 Review of Systems  Constitutional: Negative for fever and chills.  Eyes: Negative for visual disturbance.  Respiratory: Negative for cough and shortness of breath.   Cardiovascular: Positive for chest pain. Negative for palpitations and leg swelling.   Gastrointestinal: Negative for nausea, vomiting and abdominal pain.  Musculoskeletal: Negative for back pain.  Neurological: Positive for numbness. Negative for syncope, speech difficulty, weakness and headaches.      Allergies  Iodine and Shrimp  Home Medications   Prior to Admission medications   Medication Sig Start Date End Date Taking? Authorizing Provider  aspirin EC 81 MG tablet Take 81 mg by mouth daily.   Yes Historical Provider, MD   BP 145/82  Pulse 72  Temp(Src) 97.5 F (36.4 C) (Oral)  Resp 19  Ht 5\' 9"  (1.753 m)  Wt 160 lb (72.576 kg)  BMI 23.62 kg/m2  SpO2 99%  LMP 09/28/2014 Physical Exam  Nursing note and vitals reviewed. Constitutional: She is oriented to person, place, and time. She appears well-developed and well-nourished.  Non-toxic appearance. She does not have a sickly appearance. No distress.  HENT:  Head: Normocephalic and atraumatic.  Eyes: EOM are normal. Pupils are equal, round, and reactive to light.  Neck: Neck supple.  Cardiovascular: Normal rate and regular rhythm.   No lower extremity edema  Pulmonary/Chest: Effort normal and breath sounds normal. No respiratory distress. She has no wheezes. She has no rales. She exhibits no tenderness.  Patient is able to speak in complete sentences.   Abdominal: Soft. There is no tenderness. There is no rebound and no guarding.  Neurological: She is alert and oriented to person, place, and time.  Speech is clear and goal oriented, follows commands II-Visual fields were normal.   III/IV/VI-Pupils were equal and reacted. Extraocular movements were  full and conjugate.  V/VII-no facial droop.   VIII-normal.   Motor: Strength 5/5 to upper and lower extremities bilaterally. Moves all 4 extremities equally. Sensory: normal sensation to upper and lower extremities.   Skin: Skin is warm and dry. She is not diaphoretic.  Psychiatric: She has a normal mood and affect. Her behavior is normal.    ED Course   Procedures (including critical care time) Labs Review Labs Reviewed  BASIC METABOLIC PANEL - Abnormal; Notable for the following:    Sodium 135 (*)    GFR calc non Af Amer 75 (*)    GFR calc Af Amer 87 (*)    All other components within normal limits  CBC  I-STAT TROPOININ, ED  POC URINE PREG, ED    Imaging Review Dg Chest 2 View  09/29/2014   CLINICAL DATA:  Chest tightness. Heaviness in left shoulder, left arm, left leg and sometimes back of neck. Been belching a lot, "feels like i have a lot of air in my stomach/under chest." some-day smoker. Initial encounter.  EXAM: CHEST  2 VIEW  COMPARISON:  10/29/2009  FINDINGS: Midline trachea. Normal heart size and mediastinal contours. No pleural effusion or pneumothorax. Clear lungs.  IMPRESSION: No acute cardiopulmonary disease.   Electronically Signed   By: Jeronimo Greaves M.D.   On: 09/29/2014 18:59   Results for orders placed during the hospital encounter of 09/29/14  CBC      Result Value Ref Range   WBC 4.6  4.0 - 10.5 K/uL   RBC 4.34  3.87 - 5.11 MIL/uL   Hemoglobin 13.8  12.0 - 15.0 g/dL   HCT 16.1  09.6 - 04.5 %   MCV 92.4  78.0 - 100.0 fL   MCH 31.8  26.0 - 34.0 pg   MCHC 34.4  30.0 - 36.0 g/dL   RDW 40.9  81.1 - 91.4 %   Platelets 271  150 - 400 K/uL  BASIC METABOLIC PANEL      Result Value Ref Range   Sodium 135 (*) 137 - 147 mEq/L   Potassium 4.1  3.7 - 5.3 mEq/L   Chloride 98  96 - 112 mEq/L   CO2 23  19 - 32 mEq/L   Glucose, Bld 86  70 - 99 mg/dL   BUN 11  6 - 23 mg/dL   Creatinine, Ser 7.82  0.50 - 1.10 mg/dL   Calcium 9.3  8.4 - 95.6 mg/dL   GFR calc non Af Amer 75 (*) >90 mL/min   GFR calc Af Amer 87 (*) >90 mL/min   Anion gap 14  5 - 15  I-STAT TROPOININ, ED      Result Value Ref Range   Troponin i, poc 0.00  0.00 - 0.08 ng/mL   Comment 3           POC URINE PREG, ED      Result Value Ref Range   Preg Test, Ur NEGATIVE  NEGATIVE   Dg Chest 2 View  09/29/2014   CLINICAL DATA:  Chest tightness.  Heaviness in left shoulder, left arm, left leg and sometimes back of neck. Been belching a lot, "feels like i have a lot of air in my stomach/under chest." some-day smoker. Initial encounter.  EXAM: CHEST  2 VIEW  COMPARISON:  10/29/2009  FINDINGS: Midline trachea. Normal heart size and mediastinal contours. No pleural effusion or pneumothorax. Clear lungs.  IMPRESSION: No acute cardiopulmonary disease.   Electronically Signed   By: Ronaldo Miyamoto  Reche Dixonalbot M.D.   On: 09/29/2014 18:59   Ct Head Wo Contrast  09/29/2014   CLINICAL DATA:  Left-sided numbness and chest pain for 2 weeks. Initial encounter.  EXAM: CT HEAD WITHOUT CONTRAST  TECHNIQUE: Contiguous axial images were obtained from the base of the skull through the vertex without intravenous contrast.  COMPARISON:  10/30/2012; brain MRI -10/31/2012  FINDINGS: Gray-white differentiation is maintained. No CT evidence of acute large territory infarct. No intraparenchymal or extra-axial mass or hemorrhage. Normal size and configuration of the ventricles and basilar cisterns. No midline shift. Limited visualization the paranasal sinuses and mastoid air cells is normal. No air-fluid levels. Regional soft tissues appear normal. No displaced calvarial fracture.  IMPRESSION: Negative noncontrast head CT.   Electronically Signed   By: Simonne ComeJohn  Watts M.D.   On: 09/29/2014 21:19     Date: 09/29/2014  Rate: 78  Rhythm: normal sinus rhythm  QRS Axis: normal  Intervals: normal  ST/T Wave abnormalities:      Conduction Disutrbances:none  Narrative Interpretation:  Septal infarct , age undetermined  Old EKG Reviewed: unchanged    MDM   Final diagnoses:  Chest pain, unspecified chest pain type  Numbness   Patient presents with multiple complaints reports chest pain for 2 weeks nonexertional. Doubt ACS,  EKG without concerning abnormalities from previous, negative troponin, PERC negative. No concerning abnormalities on BMP. Chest x-ray negative. Patient also  complaining of left upper extremity and lower extremity paresthesias, ongoing for 2 weeks negative CT. CT without concerning abnormalities. Have patient followup as an out-patient. Discussed lab results, imaging results, and treatment plan with the patient. Return precautions given. Reports understanding and no other concerns at this time.  Patient is stable for discharge at this time. Meds given in ED:  Medications  LORazepam (ATIVAN) injection 1 mg (1 mg Intravenous Given 09/29/14 2010)    Discharge Medication List as of 09/29/2014  9:45 PM          Mellody DrownLauren Dene Landsberg, PA-C 09/30/14 0134

## 2014-10-07 NOTE — ED Provider Notes (Signed)
Medical screening examination/treatment/procedure(s) were performed by non-physician practitioner and as supervising physician I was immediately available for consultation/collaboration.      Rolland PorterMark Yumiko Alkins, MD 10/07/14 [phone]

## 2016-04-12 ENCOUNTER — Emergency Department (HOSPITAL_COMMUNITY)
Admission: EM | Admit: 2016-04-12 | Discharge: 2016-04-12 | Disposition: A | Discharge status: Home / Self Care | Payer: Self-pay | Attending: Emergency Medicine | Admitting: Emergency Medicine

## 2016-04-12 ENCOUNTER — Encounter (HOSPITAL_COMMUNITY): Payer: Self-pay | Admitting: Emergency Medicine

## 2016-04-12 DIAGNOSIS — M546 Pain in thoracic spine: Secondary | ICD-10-CM | POA: Insufficient documentation

## 2016-04-12 DIAGNOSIS — R203 Hyperesthesia: Secondary | ICD-10-CM | POA: Insufficient documentation

## 2016-04-12 DIAGNOSIS — Z87442 Personal history of urinary calculi: Secondary | ICD-10-CM | POA: Insufficient documentation

## 2016-04-12 DIAGNOSIS — Z7982 Long term (current) use of aspirin: Secondary | ICD-10-CM | POA: Insufficient documentation

## 2016-04-12 DIAGNOSIS — M62830 Muscle spasm of back: Secondary | ICD-10-CM | POA: Insufficient documentation

## 2016-04-12 DIAGNOSIS — F17200 Nicotine dependence, unspecified, uncomplicated: Secondary | ICD-10-CM | POA: Insufficient documentation

## 2016-04-12 DIAGNOSIS — Z3202 Encounter for pregnancy test, result negative: Secondary | ICD-10-CM | POA: Insufficient documentation

## 2016-04-12 HISTORY — DX: Calculus of kidney: N20.0

## 2016-04-12 LAB — BASIC METABOLIC PANEL
ANION GAP: 8 (ref 5–15)
BUN: 8 mg/dL (ref 6–20)
CALCIUM: 9.6 mg/dL (ref 8.9–10.3)
CHLORIDE: 109 mmol/L (ref 101–111)
CO2: 20 mmol/L — AB (ref 22–32)
CREATININE: 0.84 mg/dL (ref 0.44–1.00)
GFR calc non Af Amer: >60 mL/min (ref >60)
Glucose, Bld: 100 mg/dL — ABNORMAL HIGH (ref 65–99)
Potassium: 4.1 mmol/L (ref 3.5–5.1)
Sodium: 137 mmol/L (ref 135–145)

## 2016-04-12 LAB — CBC
HCT: 36.9 % (ref 36.0–46.0)
HEMOGLOBIN: 11.9 g/dL — AB (ref 12.0–15.0)
MCH: 29.5 pg (ref 26.0–34.0)
MCHC: 32.2 g/dL (ref 30.0–36.0)
MCV: 91.6 fL (ref 78.0–100.0)
PLATELETS: 328 10*3/uL (ref 150–400)
RBC: 4.03 MIL/uL (ref 3.87–5.11)
RDW: 14.2 % (ref 11.5–15.5)
WBC: 4.7 10*3/uL (ref 4.0–10.5)

## 2016-04-12 LAB — POC URINE PREG, ED: PREG TEST UR: NEGATIVE

## 2016-04-12 LAB — URINE MICROSCOPIC-ADD ON

## 2016-04-12 LAB — URINALYSIS, ROUTINE W REFLEX MICROSCOPIC
Bilirubin Urine: NEGATIVE
Glucose, UA: NEGATIVE mg/dL
Ketones, ur: NEGATIVE mg/dL
LEUKOCYTES UA: NEGATIVE
Nitrite: NEGATIVE
PROTEIN: NEGATIVE mg/dL
SPECIFIC GRAVITY, URINE: 1.04 — AB (ref 1.005–1.030)
pH: 5.5 (ref 5.0–8.0)

## 2016-04-12 MED ORDER — CYCLOBENZAPRINE HCL 10 MG PO TABS: 10.0000 mg | ORAL_TABLET | Freq: Two times a day (BID) | ORAL | Status: DC | PRN

## 2016-04-12 MED ORDER — NAPROXEN 500 MG PO TABS: 500.0000 mg | ORAL_TABLET | Freq: Three times a day (TID) | ORAL | Status: DC

## 2016-04-12 MED ORDER — DEXAMETHASONE 4 MG PO TABS
10.0000 mg | ORAL_TABLET | Freq: Once | ORAL | Status: AC
  Administered 2016-04-12: 10 mg via ORAL
  Filled 2016-04-12: qty 3

## 2016-04-12 MED ORDER — KETOROLAC TROMETHAMINE 60 MG/2ML IM SOLN
60.0000 mg | Freq: Once | INTRAMUSCULAR | Status: AC
  Administered 2016-04-12: 60 mg via INTRAMUSCULAR
  Filled 2016-04-12: qty 2

## 2016-04-12 MED ORDER — HYDROCODONE-ACETAMINOPHEN 5-325 MG PO TABS
1.0000 | ORAL_TABLET | Freq: Once | ORAL | Status: AC
  Administered 2016-04-12: 1 via ORAL
  Filled 2016-04-12: qty 1

## 2016-04-12 MED ORDER — CYCLOBENZAPRINE HCL 10 MG PO TABS
10.0000 mg | ORAL_TABLET | Freq: Once | ORAL | Status: AC
  Administered 2016-04-12: 10 mg via ORAL
  Filled 2016-04-12: qty 1

## 2016-04-12 MED ORDER — GABAPENTIN 300 MG PO CAPS: 300.0000 mg | ORAL_CAPSULE | Freq: Three times a day (TID) | ORAL | Status: DC

## 2016-04-12 NOTE — ED Notes (Signed)
Pt reports shooting pain down right side and back. Pt reports missing several days of work due to pain. Denies recent injury. States pain comes and goes. Pt with hx of kidney stone. VSS.

## 2016-04-12 NOTE — Discharge Instructions (Signed)

## 2016-04-12 NOTE — ED Provider Notes (Signed)
CSN: [id number]     Arrival date & time 04/12/16  1053 History   First MD Initiated Contact with Patient 04/12/16 1501     Chief Complaint  Patient presents with  . Back Pain     (Consider location/radiation/quality/duration/timing/severity/associated sxs/prior Treatment) HPI Comments: Thoracic back pain, burning pain, now 5/10, when pain bad is a 10/10 Missed work Thursday and Friday Better Saturday then returned No lifting, no falls, no trauma Started last Tuesday Middle part of back to Right and shoots down leg Has hx of kidney stones but it's a different spot Soothed by staying in hot tub No numbness/weakness/difficulty controlling bowel or bladder Stomach on right side feels harder but no pain    Patient is a 50 y.o. female presenting with back pain.  Back Pain Associated symptoms: no abdominal pain, no chest pain, no dysuria, no fever and no headaches     Past Medical History  Diagnosis Date  . Kidney stone    Past Surgical History  Procedure Laterality Date  . Cesarean section     Family History  Problem Relation Age of Onset  . Stroke Mother    Social History  Substance Use Topics  . Smoking status: Current Some Day Smoker  . Smokeless tobacco: Not on file  . Alcohol Use: Yes   OB History    No data available     Review of Systems  Constitutional: Negative for fever.  HENT: Negative for sore throat.   Eyes: Negative for visual disturbance.  Respiratory: Negative for cough and shortness of breath.   Cardiovascular: Negative for chest pain.  Gastrointestinal: Positive for nausea (if pain severe). Negative for abdominal pain.  Genitourinary: Negative for dysuria, vaginal bleeding, vaginal discharge and difficulty urinating.  Musculoskeletal: Positive for back pain. Negative for neck pain.  Skin: Negative for rash.  Neurological: Negative for syncope and headaches.      Allergies  Iodine and Shrimp  Home Medications   Prior to Admission  medications   Medication Sig Start Date End Date Taking? Authorizing Provider  acetaminophen (TYLENOL) 500 MG tablet Take 500 mg by mouth every 6 (six) hours as needed for mild pain.   Yes Historical Provider, MD  aspirin EC 81 MG tablet Take 81 mg by mouth daily.   Yes Historical Provider, MD  cyclobenzaprine (FLEXERIL) 10 MG tablet Take 1 tablet (10 mg total) by mouth 2 (two) times daily as needed for muscle spasms. 04/12/16   Alvira Monday, MD  gabapentin (NEURONTIN) 300 MG capsule Take 1 capsule (300 mg total) by mouth 3 (three) times daily. Begin with  (1 capsule) once a day for first day, followed by twice a day for second day, followed by 3 times a day and may titrate as needed with primary care physician. 04/12/16   Alvira Monday, MD  naproxen (NAPROSYN) 500 MG tablet Take 1 tablet (500 mg total) by mouth 3 (three) times daily with meals. 04/12/16   Alvira Monday, MD   BP 160/92 mmHg  Pulse 73  Temp(Src) 99.7 F (37.6 C) (Oral)  Resp 16  Ht  (1.676 m)  Wt 173 lb (78.472 kg)  BMI 27.94 kg/m2  SpO2 100% Physical Exam  Constitutional: She is oriented to person, place, and time. She appears well-developed and well-nourished. No distress.  HENT:  Head: Normocephalic and atraumatic.  Eyes: Conjunctivae and EOM are normal.  Neck: Normal range of motion.  Cardiovascular: Normal rate, regular rhythm, normal heart sounds and intact distal pulses.  Exam  reveals no gallop and no friction rub.   No murmur heard. Pulmonary/Chest: Effort normal and breath sounds normal. No respiratory distress. She has no wheezes. She has no rales.  Abdominal: Soft. She exhibits no distension. There is no tenderness. There is no guarding.  Musculoskeletal: She exhibits no edema.       Thoracic back: She exhibits tenderness (severe right paraspinal, pain with light touch), bony tenderness and spasm. She exhibits no swelling, no edema, no deformity and no laceration.  Neurological: She is alert and  oriented to person, place, and time.  Skin: Skin is warm and dry. No rash noted. She is not diaphoretic. No erythema.  Nursing note and vitals reviewed.   ED Course  Procedures (including critical care time) Labs Review Labs Reviewed  BASIC METABOLIC PANEL - Abnormal; Notable for the following:    CO2 20 (*)    Glucose, Bld 100 (*)    All other components within normal limits  CBC - Abnormal; Notable for the following:    Hemoglobin 11.9 (*)    All other components within normal limits  URINALYSIS, ROUTINE W REFLEX MICROSCOPIC (NOT [facility]) - Abnormal; Notable for the following:    APPearance CLOUDY (*)    Specific Gravity, Urine 1.040 (*)    Hgb urine dipstick TRACE (*)    All other components within normal limits  URINE MICROSCOPIC-ADD ON - Abnormal; Notable for the following:    Squamous Epithelial / LPF 6-30 (*)    Bacteria, UA FEW (*)    All other components within normal limits  POC URINE PREG, ED    Imaging Review No results found. I have personally reviewed and evaluated these images and lab results as part of my medical decision-making.   EKG Interpretation None      MDM   Final diagnoses:  Right-sided thoracic back pain  Hyperesthesia   50 year old female with a history of nephrolithiasis presents with concern for burning thoracic back pain. Patient had labs of which were within normal limits, including no sign of UTI and a negative pregnancy test. Back pain described is thoracic, paraspinal, and associated with hyperesthesia. There is no sign of shingles rash, and patient denies history of chickenpox. Given the significant tenderness as well as hyperesthesia over this area, have low suspicion for other intrathoracic cause of pain, including low suspicion for PE, dissection.  Location is more superior than pain about especially nephrolithiasis.  Patient has a normal neurologic exam and denies any urinary retention or overflow incontinence, stool incontinence,  saddle anesthesia, fever, IV drug use, trauma, chronic steroid use or immunocompromise and have low suspicion suspicion for cauda equina, fracture, epidural abscess, or vertebral osteomyelitis.  Do suspect a musculoskeletal cause of patient's back pain, possible herniated disc or other spinal etiology. Recommend follow-up with a primary care physician. Patient was given a shot of Toradol, and Flexeril the emergency department. She is given a prescription for Flexeril, gabapentin and naproxen. Patient discharged in stable condition with understanding of reasons to return.   Alvira MondayErin Soriah Leeman, MD 04/12/16 1900

## 2020-11-02 ENCOUNTER — Observation Stay (HOSPITAL_COMMUNITY)
Admission: EM | Admit: 2020-11-02 | Discharge: 2020-11-04 | Disposition: A | Discharge status: Home / Self Care | Payer: 59 | Attending: Internal Medicine | Admitting: Internal Medicine

## 2020-11-02 ENCOUNTER — Encounter (HOSPITAL_COMMUNITY): Payer: Self-pay

## 2020-11-02 ENCOUNTER — Ambulatory Visit (HOSPITAL_COMMUNITY)
Admission: EM | Admit: 2020-11-02 | Discharge: 2020-11-02 | Disposition: A | Discharge status: Home / Self Care | Payer: 59 | Attending: Internal Medicine | Admitting: Internal Medicine

## 2020-11-02 ENCOUNTER — Emergency Department (HOSPITAL_COMMUNITY): Payer: 59

## 2020-11-02 ENCOUNTER — Other Ambulatory Visit: Payer: Self-pay

## 2020-11-02 DIAGNOSIS — F17210 Nicotine dependence, cigarettes, uncomplicated: Secondary | ICD-10-CM | POA: Insufficient documentation

## 2020-11-02 DIAGNOSIS — R079 Chest pain, unspecified: Secondary | ICD-10-CM | POA: Diagnosis not present

## 2020-11-02 DIAGNOSIS — R0789 Other chest pain: Principal | ICD-10-CM | POA: Insufficient documentation

## 2020-11-02 DIAGNOSIS — Z20822 Contact with and (suspected) exposure to covid-19: Secondary | ICD-10-CM | POA: Diagnosis not present

## 2020-11-02 DIAGNOSIS — I440 Atrioventricular block, first degree: Secondary | ICD-10-CM

## 2020-11-02 DIAGNOSIS — I10 Essential (primary) hypertension: Secondary | ICD-10-CM

## 2020-11-02 DIAGNOSIS — R030 Elevated blood-pressure reading, without diagnosis of hypertension: Secondary | ICD-10-CM | POA: Diagnosis not present

## 2020-11-02 DIAGNOSIS — R9431 Abnormal electrocardiogram [ECG] [EKG]: Secondary | ICD-10-CM

## 2020-11-02 DIAGNOSIS — Z720 Tobacco use: Secondary | ICD-10-CM | POA: Diagnosis present

## 2020-11-02 DIAGNOSIS — Z23 Encounter for immunization: Secondary | ICD-10-CM | POA: Diagnosis not present

## 2020-11-02 DIAGNOSIS — E785 Hyperlipidemia, unspecified: Secondary | ICD-10-CM

## 2020-11-02 LAB — BASIC METABOLIC PANEL
Anion gap: 10 (ref 5–15)
BUN: 9 mg/dL (ref 6–20)
CO2: 25 mmol/L (ref 22–32)
Calcium: 9.4 mg/dL (ref 8.9–10.3)
Chloride: 103 mmol/L (ref 98–111)
Creatinine, Ser: 0.89 mg/dL (ref 0.44–1.00)
GFR, Estimated: >60 mL/min (ref >60)
Glucose, Bld: 108 mg/dL — ABNORMAL HIGH (ref 70–99)
Potassium: 4.3 mmol/L (ref 3.5–5.1)
Sodium: 138 mmol/L (ref 135–145)

## 2020-11-02 LAB — CBC
HCT: 39.3 % (ref 36.0–46.0)
Hemoglobin: 12.6 g/dL (ref 12.0–15.0)
MCH: 31.5 pg (ref 26.0–34.0)
MCHC: 32.1 g/dL (ref 30.0–36.0)
MCV: 98.3 fL (ref 80.0–100.0)
Platelets: 271 10*3/uL (ref 150–400)
RBC: 4 MIL/uL (ref 3.87–5.11)
RDW: 12.9 % (ref 11.5–15.5)
WBC: 5 10*3/uL (ref 4.0–10.5)
nRBC: 0 % (ref 0.0–0.2)

## 2020-11-02 LAB — TROPONIN I (HIGH SENSITIVITY)
Troponin I (High Sensitivity): 4 ng/L (ref <18)
Troponin I (High Sensitivity): 4 ng/L (ref <18)

## 2020-11-02 LAB — RESPIRATORY PANEL BY RT PCR (FLU A&B, COVID)
Influenza A by PCR: NEGATIVE
Influenza B by PCR: NEGATIVE
SARS Coronavirus 2 by RT PCR: NEGATIVE

## 2020-11-02 MED ORDER — ENOXAPARIN SODIUM 40 MG/0.4ML ~~LOC~~ SOLN
40.0000 mg | SUBCUTANEOUS | Status: DC
  Administered 2020-11-02 – 2020-11-03 (×2): 40 mg via SUBCUTANEOUS
  Filled 2020-11-02 (×2): qty 0.4

## 2020-11-02 MED ORDER — ONDANSETRON HCL 4 MG/2ML IJ SOLN: 4.0000 mg | Freq: Four times a day (QID) | INTRAMUSCULAR | Status: DC | PRN

## 2020-11-02 MED ORDER — NITROGLYCERIN 0.4 MG SL SUBL: 0.4000 mg | SUBLINGUAL_TABLET | SUBLINGUAL | Status: DC | PRN

## 2020-11-02 MED ORDER — ASPIRIN EC 81 MG PO TBEC
81.0000 mg | DELAYED_RELEASE_TABLET | Freq: Every day | ORAL | Status: DC
  Administered 2020-11-03 – 2020-11-04 (×2): 81 mg via ORAL
  Filled 2020-11-02 (×2): qty 1

## 2020-11-02 MED ORDER — ASPIRIN 81 MG PO CHEW
324.0000 mg | CHEWABLE_TABLET | Freq: Once | ORAL | Status: AC
  Administered 2020-11-02: 324 mg via ORAL
  Filled 2020-11-02: qty 4

## 2020-11-02 MED ORDER — ACETAMINOPHEN 325 MG PO TABS
650.0000 mg | ORAL_TABLET | ORAL | Status: DC | PRN
  Administered 2020-11-03: 650 mg via ORAL
  Filled 2020-11-02: qty 2

## 2020-11-02 NOTE — ED Triage Notes (Signed)
Pt in with c/o left side chest pain that radiates to left arm and right chest that worsened last night.   States she felt heaviness in her chest   Denies sob, diaphoresis, n/v, change in loc, or hx of cardiac issues  EKG completed and shown to provider Dahlia Byes, NP. Provider advised pt to be seen in ED for further cardiac workup.

## 2020-11-02 NOTE — H&P (Signed)
History and Physical    Sarah Hunter PIR:[id number] DOB: 1966/09/09 DOA: 11/02/2020  PCP: System, Provider Not In  Patient coming from: Home via urgent care  I have personally briefly reviewed patient's old medical records in [facility]  Chief Complaint: Chest pain  HPI: Sarah Hunter is a 54 y.o. female with medical history significant for tobacco use who presents to the ED for evaluation of chest pain.  Patient states she developed new onset of left upper extremity pain this morning while she was resting in bed.  The pain moved to her left chest and felt like a heavy pressure sensation.  Pain did not radiate to her neck or jaw.  She did not have any associated nausea, vomiting, diaphoresis, dyspnea, or palpitations.  She not having lightheadedness/dizziness or loss of consciousness.  Symptoms lasted about 10-15 minutes before resolving on their own.  She also reports 2 months of acid reflux/heartburn which she says feels different than her chest pressure episode.  She came to the ED for further evaluation.  Patient states she had a previous stress test approximately 20 years ago when she was living in [place] which was reportedly normal.  She reports a history of heart disease in her maternal grandmother.  She says her father side of the family has diabetes.  Both of her parents are deceased.  She is a current smoker of 0.5 pack/day since age of 4.  She reports drinking 2-3 bottles of wine per week.  She uses marijuana occasionally.  She denies any cocaine or IV drug use.   ED Course:  Initial vitals showed BP 174/93, pulse 64, RR 16, temp 98.4 Fahrenheit, SPO2 100% on room air.  Labs showed sodium 138, potassium 4.3, bicarb 25, BUN 9, creatinine 0.89, serum glucose 108, WBC 5.0, hemoglobin 12.6, platelets 271,000, high-sensitivity troponin I x4.  2 view chest x-ray is negative for focal consolidation, edema, or effusion.  EKG showed sinus rhythm, first-degree AV block, Q waves V2,  T wave inversion leads II, III, aVF, V4, V5.  When compared to prior from April 2017, T wave inversions and first-degree AV block are new.  Patient was given aspirin 324 mg.  The hospitalist service was consulted to admit for further evaluation management.  EDP discussed with on-call cardiology team will follow in a.m. for potential stress testing.  Review of Systems: All systems reviewed and are negative except as documented in history of present illness above.   Past Medical History:  Diagnosis Date  . Kidney stone     Past Surgical History:  Procedure Laterality Date  . CESAREAN SECTION      Social History:  reports that she has been smoking cigarettes. She has been smoking about 0.50 packs per day. She has never used smokeless tobacco. She reports current alcohol use. She reports current drug use. Drug: Marijuana.  Allergies  Allergen Reactions  . Iodine Swelling    Facial swelling  . Shrimp [Shellfish Allergy] Swelling    Facial swelling    Family History  Problem Relation Age of Onset  . Stroke Mother   . Diabetes Father   . Heart disease Maternal Grandmother      Prior to Admission medications   Medication Sig Start Date End Date Taking? Authorizing Provider  BIOTIN PO Take 2 tablets by mouth daily.   Yes [provider]  Multiple Vitamin (MULTIVITAMIN WITH MINERALS) TABS tablet Take 1 tablet by mouth daily.   Yes [provider]  naproxen sodium (  ALEVE) 220 MG tablet Take 440 mg by mouth 2 (two) times daily as needed (pain/headache).   Yes [provider]  OVER THE COUNTER MEDICATION Take 2 capsules by mouth daily. Sea Moss   Yes [provider]  OVER THE COUNTER MEDICATION Take 2 capsules by mouth daily. Beet Root   Yes [provider]    Physical Exam: Vitals:   11/02/20 1518 11/02/20 2027 11/02/20 2032 11/02/20 2156  BP: (!) 166/95 (!) 174/93 (!) 182/88 (!) 164/94  Pulse: 60 64 62 (!) 52  Resp: 16 16 18 17     Temp:  98.4 F (36.9 C) 98.3 F (36.8 C) 98 F (36.7 C)  TempSrc:  Oral Oral Oral  SpO2: 100% 100% 100% 100%  Weight:   81.6 kg   Height:   5\' 6"  (1.676 m)    Constitutional: Resting supine in bed, NAD, calm, comfortable Eyes: PERRL, lids and conjunctivae normal ENMT: Mucous membranes are moist. Posterior pharynx clear of any exudate or lesions.Normal dentition.  Neck: normal, supple, no masses. Respiratory: clear to auscultation bilaterally, no wheezing, no crackles. Normal respiratory effort. No accessory muscle use.  Cardiovascular: Regular rate and rhythm, no murmurs / rubs / gallops. No extremity edema. 2+ pedal pulses. Abdomen: no tenderness, no masses palpated. No hepatosplenomegaly. Bowel sounds positive.  Musculoskeletal: no clubbing / cyanosis. No joint deformity upper and lower extremities. Good ROM, no contractures. Normal muscle tone.  No chest wall tenderness to palpation. Skin: no rashes, lesions, ulcers. No induration Neurologic: CN 2-12 grossly intact. Sensation intact, Strength 5/5 in all 4.  Psychiatric: Normal judgment and insight. Alert and oriented x 3. Normal mood.   Labs on Admission: I have personally reviewed following labs and imaging studies  CBC: Recent Labs  Lab 11/02/20 1243  WBC 5.0  HGB 12.6  HCT 39.3  MCV 98.3  PLT 271   Basic Metabolic Panel: Recent Labs  Lab 11/02/20 1243  NA 138  K 4.3  CL 103  CO2 25  GLUCOSE 108*  BUN 9  CREATININE 0.89  CALCIUM 9.4   GFR: Estimated Creatinine Clearance: 78.7 mL/min (by C-G formula based on SCr of 0.89 mg/dL). Liver Function Tests: No results for input(s): AST, ALT, ALKPHOS, BILITOT, PROT, ALBUMIN in the last 168 hours. No results for input(s): LIPASE, AMYLASE in the last 168 hours. No results for input(s): AMMONIA in the last 168 hours. Coagulation Profile: No results for input(s): INR, PROTIME in the last 168 hours. Cardiac Enzymes: No results for input(s): CKTOTAL, CKMB, CKMBINDEX,  TROPONINI in the last 168 hours. BNP (last 3 results) No results for input(s): PROBNP in the last 8760 hours. HbA1C: No results for input(s): HGBA1C in the last 72 hours. CBG: No results for input(s): GLUCAP in the last 168 hours. Lipid Profile: No results for input(s): CHOL, HDL, LDLCALC, TRIG, CHOLHDL, LDLDIRECT in the last 72 hours. Thyroid Function Tests: No results for input(s): TSH, T4TOTAL, FREET4, T3FREE, THYROIDAB in the last 72 hours. Anemia Panel: No results for input(s): VITAMINB12, FOLATE, FERRITIN, TIBC, IRON, RETICCTPCT in the last 72 hours. Urine analysis:    Component Value Date/Time   COLORURINE YELLOW 04/12/2016 1416   APPEARANCEUR CLOUDY (A) 04/12/2016 1416   LABSPEC 1.040 (H) 04/12/2016 1416   PHURINE 5.5 04/12/2016 1416   GLUCOSEU NEGATIVE 04/12/2016 1416   HGBUR TRACE (A) 04/12/2016 1416   BILIRUBINUR NEGATIVE 04/12/2016 1416   KETONESUR NEGATIVE 04/12/2016 1416   PROTEINUR NEGATIVE 04/12/2016 1416   UROBILINOGEN 0.2 10/31/2012 0009  NITRITE NEGATIVE 04/12/2016 1416   LEUKOCYTESUR NEGATIVE 04/12/2016 1416    Radiological Exams on Admission: DG Chest 2 View  Result Date: 11/02/2020 CLINICAL DATA:  Chest pain. EXAM: CHEST - 2 VIEW COMPARISON:  09/29/2014. FINDINGS: The heart size and mediastinal contours are within normal limits. Both lungs are clear. No visible pleural effusions or pneumothorax. The visualized skeletal structures are unremarkable. IMPRESSION: No active cardiopulmonary disease. Electronically Signed   By: Feliberto Harts MD   On: 11/02/2020 13:04    EKG: Personally reviewed. Sinus rhythm, first-degree AV block, Q waves V2, T wave inversion leads II, III, aVF, V4, V5.  When compared to prior from April 2017, T wave inversions and first-degree AV block are new.  Assessment/Plan Principal Problem:   Chest pain Active Problems:   Essential hypertension   Tobacco use  Myrtha Tonkovich is a 54 y.o. female with medical history significant for  tobacco use who is admitted for evaluation of chest pain.  Chest pain: Patient with transient anginal type chest pain.  Troponins are negative x2.  EKG does show inferolateral T wave inversions and first-degree AV block, new when compared to previous EKG from April 2017.  Has no known prior cardiac disease.  She is currently chest pain-free. -Continue aspirin 81 mg daily -Monitor on telemetry -EDP discussed with on-call cardiology who anticipates stress testing tomorrow -Keep n.p.o. after midnight -Check lipid panel -Sublingual nitroglycerin as needed  Elevated blood pressure: BP elevated on admission.  Has not been formally diagnosed.  Anticipate she will require antihypertensive therapy prior to discharge.  Tobacco use: Reports smoking 0.5 PPD since age of 61.  Patient counseled on smoking cessation.  She declines nicotine patch at this time.  DVT prophylaxis: Lovenox Code Status: Full code, confirmed with patient Family Communication: Discussed with patient, she has discussed with family Disposition Plan: From home and likely discharge to home pending chest pain work-up Consults called: EDP consulted cardiology will see in a.m. Admission status:  Status is: Observation  The patient remains OBS appropriate and will d/c before 2 midnights.  Dispo: The patient is from: Home              Anticipated d/c is to: Home              Anticipated d/c date is: 1 day              Patient currently is not medically stable to d/c.   Darreld Mclean MD [facility]  If 7PM-7AM, please contact night-coverage [email or web address]  11/02/2020, 10:35 PM

## 2020-11-02 NOTE — ED Triage Notes (Signed)
Pt was advised to come by urgent care due to pts left sided chest pain as well as L arm shooting pain. Pt denies any sob,nausea, vomiting.

## 2020-11-02 NOTE — ED Provider Notes (Signed)
[facility] EMERGENCY DEPARTMENT Provider Note   CSN: [id number] Arrival date & time: 11/02/20  1148     History Chief Complaint  Patient presents with  . Chest Pain    Sarah Hunter is a 54 y.o. female.  Patient presents with intermittent chest pain since yesterday.  Patient does not have a primary care doctor so unknown medical problems.  Patient smokes cigarettes for years.  No known cardiac disease.  No recent surgeries or blood clot history.  No leg swelling.  Patient had episode lasting probably 5 to 10 minutes left arm squeezing followed by a left chest pressure yesterday that resolved.  No recent exertional symptoms.  Today patient had similar episode primarily left chest and left arm.  No neck pain, no upper extremity weakness, no recent stress test.  Currently no significant symptoms.        Past Medical History:  Diagnosis Date  . Kidney stone     Patient Active Problem List   Diagnosis Date Noted  . Chest pain 11/02/2020  . Elevated blood pressure reading 11/02/2020  . Tobacco use 11/02/2020    Past Surgical History:  Procedure Laterality Date  . CESAREAN SECTION       OB History   No obstetric history on file.     Family History  Problem Relation Age of Onset  . Stroke Mother   . Diabetes Father   . Heart disease Maternal Grandmother     Social History   Tobacco Use  . Smoking status: Current Every Day Smoker    Packs/day: 0.50    Types: Cigarettes  . Smokeless tobacco: Never Used  . Tobacco comment: Half pack per day since age 2.  Substance Use Topics  . Alcohol use: Yes    Comment: 11/02/20: 2-3 bottles of wine per week  . Drug use: Yes    Types: Marijuana    Home Medications Prior to Admission medications   Medication Sig Start Date End Date Taking? Authorizing Provider  BIOTIN PO Take 2 tablets by mouth daily.   Yes [provider]  Multiple Vitamin (MULTIVITAMIN WITH MINERALS) TABS tablet Take 1 tablet  by mouth daily.   Yes [provider]  naproxen sodium (ALEVE) 220 MG tablet Take 440 mg by mouth 2 (two) times daily as needed (pain/headache).   Yes [provider]  OVER THE COUNTER MEDICATION Take 2 capsules by mouth daily. Sea Moss   Yes [provider]  OVER THE COUNTER MEDICATION Take 2 capsules by mouth daily. Beet Root   Yes [provider]    Allergies    Iodine and Shrimp [shellfish allergy]  Review of Systems   Review of Systems  Constitutional: Negative for chills and fever.  HENT: Negative for congestion.   Eyes: Negative for visual disturbance.  Respiratory: Negative for shortness of breath.   Cardiovascular: Positive for chest pain. Negative for leg swelling.  Gastrointestinal: Negative for abdominal pain and vomiting.  Genitourinary: Negative for dysuria and flank pain.  Musculoskeletal: Negative for back pain, neck pain and neck stiffness.  Skin: Negative for rash.  Neurological: Negative for light-headedness and headaches.    Physical Exam Updated Vital Signs BP (!) 155/126   Pulse (!) 56   Temp 98 F (36.7 C) (Oral)   Resp (!) 26   Ht 5\' 6"  (1.676 m)   Wt 81.6 kg   SpO2 99%   BMI 29.05 kg/m   Physical Exam Vitals and nursing note reviewed.  Constitutional:      Appearance: She is well-developed.  HENT:     Head: Normocephalic and atraumatic.  Eyes:     General:        Right eye: No discharge.        Left eye: No discharge.     Conjunctiva/sclera: Conjunctivae normal.  Neck:     Trachea: No tracheal deviation.  Cardiovascular:     Rate and Rhythm: Normal rate and regular rhythm.     Pulses:          Radial pulses are 2+ on the right side and 2+ on the left side.     Heart sounds: Normal heart sounds.  Pulmonary:     Effort: Pulmonary effort is normal.     Breath sounds: Normal breath sounds.  Abdominal:     General: There is no distension.     Palpations: Abdomen is soft.     Tenderness: There is no  abdominal tenderness. There is no guarding.  Musculoskeletal:     Cervical back: Normal range of motion and neck supple.     Right lower leg: No tenderness. No edema.     Left lower leg: No tenderness. No edema.  Skin:    General: Skin is warm.     Findings: No rash.  Neurological:     Mental Status: She is alert and oriented to person, place, and time.     ED Results / Procedures / Treatments   Labs (all labs ordered are listed, but only abnormal results are displayed) Labs Reviewed  BASIC METABOLIC PANEL - Abnormal; Notable for the following components:      Result Value   Glucose, Bld 108 (*)    All other components within normal limits  RESPIRATORY PANEL BY RT PCR (FLU A&B, COVID)  CBC  HIV ANTIBODY (ROUTINE TESTING W REFLEX)  LIPID PANEL  TROPONIN I (HIGH SENSITIVITY)  TROPONIN I (HIGH SENSITIVITY)    EKG EKG Interpretation  Date/Time:  Monday November 02 2020 12:50:51 EST Ventricular Rate:  58 PR Interval:  226 QRS Duration: 80 QT Interval:  390 QTC Calculation: 382 R Axis:     Text Interpretation: Sinus rhythm Septal infarct , age undetermined T wave abnormality, consider inferior ischemia T wave abnormality, consider anterolateral ischemia Abnormal ECG Confirmed by Blane Ohara [phone]) on 11/02/2020 8:54:57 PM   Radiology DG Chest 2 View  Result Date: 11/02/2020 CLINICAL DATA:  Chest pain. EXAM: CHEST - 2 VIEW COMPARISON:  09/29/2014. FINDINGS: The heart size and mediastinal contours are within normal limits. Both lungs are clear. No visible pleural effusions or pneumothorax. The visualized skeletal structures are unremarkable. IMPRESSION: No active cardiopulmonary disease. Electronically Signed   By: Feliberto Harts MD   On: 11/02/2020 13:04    Procedures Procedures (including critical care time)  Medications Ordered in ED Medications  acetaminophen (TYLENOL) tablet 650 mg (has no administration in time range)  ondansetron (ZOFRAN) injection 4 mg  (has no administration in time range)  enoxaparin (LOVENOX) injection 40 mg (has no administration in time range)  aspirin EC tablet 81 mg (has no administration in time range)  nitroGLYCERIN (NITROSTAT) SL tablet 0.4 mg (has no administration in time range)  aspirin chewable tablet 324 mg (324 mg Oral Given 11/02/20 2111)    ED Course  I have reviewed the triage vital signs and the nursing notes.  Pertinent labs & imaging results that were available during my care of the patient were reviewed by me and considered  in my medical decision making (see chart for details).    MDM Rules/Calculators/A&P                          Patient presents after 2 episodes of chest and left arm discomfort.  Currently symptom-free.  EKG reviewed showing T wave inversions across the precordium. Delta troponin negative.  Basic blood work unremarkable normal hemoglobin normal kidney function. Discussed with patient and hospitalist plan for observation for stress test tomorrow.  Discussed with cardiologist on-call who agrees and will set that up for the morning. Patient comfortable this plan.  Covid test ordered for admission.  Aspirin given.     Final Clinical Impression(s) / ED Diagnoses Final diagnoses:  Abnormal EKG  Acute chest pain    Rx / DC Orders ED Discharge Orders    None       Blane Ohara, MD 11/02/20 2330

## 2020-11-02 NOTE — ED Notes (Signed)
Patient is being discharged from the Urgent Care and sent to the Emergency Department via pov. Per Dahlia Byes, NP patient is in need of higher level of care due to chest pain and arm numbness. Patient is aware and verbalizes understanding of plan of care.  Vitals:   11/02/20 1121  BP: (!) 156/82  Pulse: 80  Resp: 20  SpO2: 100%

## 2020-11-03 ENCOUNTER — Observation Stay (HOSPITAL_COMMUNITY): Payer: 59

## 2020-11-03 ENCOUNTER — Observation Stay (HOSPITAL_BASED_OUTPATIENT_CLINIC_OR_DEPARTMENT_OTHER): Payer: 59

## 2020-11-03 DIAGNOSIS — R9431 Abnormal electrocardiogram [ECG] [EKG]: Secondary | ICD-10-CM

## 2020-11-03 DIAGNOSIS — Z720 Tobacco use: Secondary | ICD-10-CM

## 2020-11-03 DIAGNOSIS — R030 Elevated blood-pressure reading, without diagnosis of hypertension: Secondary | ICD-10-CM

## 2020-11-03 DIAGNOSIS — E785 Hyperlipidemia, unspecified: Secondary | ICD-10-CM

## 2020-11-03 DIAGNOSIS — R079 Chest pain, unspecified: Secondary | ICD-10-CM

## 2020-11-03 DIAGNOSIS — I10 Essential (primary) hypertension: Secondary | ICD-10-CM

## 2020-11-03 DIAGNOSIS — I440 Atrioventricular block, first degree: Secondary | ICD-10-CM

## 2020-11-03 LAB — HEMOGLOBIN A1C
Hgb A1c MFr Bld: 5.4 % (ref 4.8–5.6)
Mean Plasma Glucose: 108.28 mg/dL

## 2020-11-03 LAB — ECHOCARDIOGRAM COMPLETE
Area-P 1/2: 2.8 cm2
Calc EF: 56.2 %
Height: 66 in
S' Lateral: 3.3 cm
Single Plane A2C EF: 54.4 %
Single Plane A4C EF: 58.1 %
Weight: 2880 oz

## 2020-11-03 LAB — LIPID PANEL
Cholesterol: 230 mg/dL — ABNORMAL HIGH (ref 0–200)
HDL: 44 mg/dL (ref >40)
LDL Cholesterol: 148 mg/dL — ABNORMAL HIGH (ref 0–99)
Total CHOL/HDL Ratio: 5.2 RATIO
Triglycerides: 191 mg/dL — ABNORMAL HIGH (ref <150)
VLDL: 38 mg/dL (ref 0–40)

## 2020-11-03 LAB — CBG MONITORING, ED: Glucose-Capillary: 78 mg/dL (ref 70–99)

## 2020-11-03 MED ORDER — METOPROLOL TARTRATE 5 MG/5ML IV SOLN
INTRAVENOUS | Status: AC
  Filled 2020-11-03: qty 5

## 2020-11-03 MED ORDER — IOHEXOL 350 MG/ML SOLN
80.0000 mL | Freq: Once | INTRAVENOUS | Status: AC | PRN
  Administered 2020-11-03: 80 mL via INTRAVENOUS

## 2020-11-03 MED ORDER — NITROGLYCERIN 0.4 MG SL SUBL
SUBLINGUAL_TABLET | SUBLINGUAL | Status: AC
  Administered 2020-11-03: 0.8 mg
  Filled 2020-11-03: qty 2

## 2020-11-03 MED ORDER — INFLUENZA VAC SPLIT QUAD 0.5 ML IM SUSY
0.5000 mL | PREFILLED_SYRINGE | INTRAMUSCULAR | Status: AC
  Administered 2020-11-04: 0.5 mL via INTRAMUSCULAR
  Filled 2020-11-03: qty 0.5

## 2020-11-03 MED ORDER — ATORVASTATIN CALCIUM 40 MG PO TABS
40.0000 mg | ORAL_TABLET | Freq: Every day | ORAL | Status: DC
  Administered 2020-11-03 – 2020-11-04 (×2): 40 mg via ORAL
  Filled 2020-11-03 (×2): qty 1

## 2020-11-03 MED ORDER — AMLODIPINE BESYLATE 5 MG PO TABS
5.0000 mg | ORAL_TABLET | Freq: Every day | ORAL | Status: DC
  Administered 2020-11-03 – 2020-11-04 (×2): 5 mg via ORAL
  Filled 2020-11-03 (×2): qty 1

## 2020-11-03 NOTE — ED Notes (Signed)
Pt transported to CT ?

## 2020-11-03 NOTE — ED Notes (Signed)
Cardiology at bedside.

## 2020-11-03 NOTE — Progress Notes (Signed)
  Echocardiogram 2D Echocardiogram has been performed.  Leta Jungling M 11/03/2020, 10:57 AM

## 2020-11-03 NOTE — Consult Note (Addendum)
The patient has been seen in conjunction with Judy PimpleKrista Kroeger, PAC. All aspects of care have been considered and discussed. The patient has been personally interviewed, examined, and all clinical data has been reviewed.   Chest discomfort with atypical features in a 54 year old female who has untreated hypertension, significant hyperlipidemia, and smokes cigarettes.  ECG is chronically abnormal with precordial T wave inversion.  The echo reveals normal LV size and function.  Cardiac markers are negative.  Recommend coronary CT angio with morphology and FFR if indicated to define anatomy, establish a diagnosis, and help guide therapy.    Cardiology Consultation:   Patient ID: Sarah Hunter MRN: [id number]; DOB: 09-21-1966  Admit date: 11/02/2020 Date of Consult: 11/03/2020  Primary Care Provider: System, [facility]ologist: [facility]; Dr. Nat MathSmith [facility] Electrophysiologist:  None    Patient Profile:   Sarah Hunter is a 54 y.o. female with a PMH of tobacco abuse, who is being seen today for the evaluation of chest pain at the request of Dr. Allena KatzPatel.  History of Present Illness:   Sarah Hunter was in her usual state of health until the morning of 11/02/20 when she had sudden onset left upper arm pain which radiated to her left chest. She described a heavy pressure sensation. No radiation of pain to neck or jaw and no associated nausea, vomiting, diaphoresis, SOB, palpitations, dizziness, lightheadedness, or syncope. She reported symptoms lasted for several minutes before resolving spontaneously. Reports pressure felt different than prior acid reflux episodes prompting her to present to the ED for further evaluation.   She does not follow with a cardiologist. Reports having a stress test 20 years ago which was normal. She is a current smoker - 0.5 ppd since the age of 54. She drinks 2-3 bottles of wine per week. She uses recreational marijuana but  denies cocaine or IVDA. She has family history of CAD in her maternal grandmother, otherwise no family history of CAD. Mother has a history of stroke and father with DM type 2.   At the time of this evaluation she is chest pain free. No recurrent symptoms since arrival to the ED. She describes intermittent left arm pain which feels like a tightness sensation between her left shoulder and elbow. This has been occurring for several months without rhyme or reason. In the early morning hours of 11/02/20 she had left arm pain, however this time was associated with radiation to her left chest. She described a heaviness/pressure sensation that lasted for a few minutes before resolving spontaneously. She is not very active at baseline though denies any chest pain with activity. She has occasional DOE which has not changed recently which she attributes to her long smoking history. No complaints of palpitations, LE edema, orthopnea, claudication, dizziness, lightheadedness, or syncope. Noted to have apneic episodes on telemetry. She does snore and has occasional PND.   ED course: Hypertensive 140s-180s/80s-100s, bradycardic to 50s, otherwise VSS. Labs notable for electrolytes wnl, Cr 0.89, CBC wnl, HsTrop 4 x2, Tcholesterol 230, LDL 148, Triglycerides 191, Hgb A1C 5.4. EKG with sinus rhythm with 1st degree AV block with rate 65 bpm, TWI in infero-anterolateral leads (new compared to 2017). CXR is without acute findings. She was given aspirin 324mg  in the ED and admitted to medicine. Started on atorvastatin 40mg . Cardiology asked to evaluate.    Past Medical History:  Diagnosis Date  . Kidney stone     Past Surgical History:  Procedure Laterality Date  .  CESAREAN SECTION       Home Medications:  Prior to Admission medications   Medication Sig Start Date End Date Taking? Authorizing Provider  BIOTIN PO Take 2 tablets by mouth daily.   Yes [provider]  Multiple Vitamin (MULTIVITAMIN WITH  MINERALS) TABS tablet Take 1 tablet by mouth daily.   Yes [provider]  naproxen sodium (ALEVE) 220 MG tablet Take 440 mg by mouth 2 (two) times daily as needed (pain/headache).   Yes [provider]  OVER THE COUNTER MEDICATION Take 2 capsules by mouth daily. Sea Moss   Yes [provider]  OVER THE COUNTER MEDICATION Take 2 capsules by mouth daily. Beet Root   Yes [provider]    Inpatient Medications: Scheduled Meds: . amLODipine  5 mg Oral Daily  . aspirin EC  81 mg Oral Daily  . atorvastatin  40 mg Oral Daily  . enoxaparin (LOVENOX) injection  40 mg Subcutaneous Q24H   Continuous Infusions:  PRN Meds: acetaminophen, nitroGLYCERIN, ondansetron (ZOFRAN) IV  Allergies:    Allergies  Allergen Reactions  . Iodine Swelling    Facial swelling  . Shrimp [Shellfish Allergy] Swelling    Facial swelling    Social History:   Social History   Socioeconomic History  . Marital status: Single    Spouse name: Not on file  . Number of children: Not on file  . Years of education: Not on file  . Highest education level: Not on file  Occupational History  . Not on file  Tobacco Use  . Smoking status: Current Every Day Smoker    Packs/day: 0.50    Types: Cigarettes  . Smokeless tobacco: Never Used  . Tobacco comment: Half pack per day since age 8.  Substance and Sexual Activity  . Alcohol use: Yes    Comment: 11/02/20: 2-3 bottles of wine per week  . Drug use: Yes    Types: Marijuana  . Sexual activity: Not on file  Other Topics Concern  . Not on file  Social History Narrative  . Not on file   Social Determinants of Health   Financial Resource Strain:   . Difficulty of Paying Living Expenses: Not on file  Food Insecurity:   . Worried About Programme researcher, broadcasting/film/video in the Last Year: Not on file  . Ran Out of Food in the Last Year: Not on file  Transportation Needs:   . Lack of Transportation (Medical): Not on file  . Lack of  Transportation (Non-Medical): Not on file  Physical Activity:   . Days of Exercise per Week: Not on file  . Minutes of Exercise per Session: Not on file  Stress:   . Feeling of Stress : Not on file  Social Connections:   . Frequency of Communication with Friends and Family: Not on file  . Frequency of Social Gatherings with Friends and Family: Not on file  . Attends Religious Services: Not on file  . Active Member of Clubs or Organizations: Not on file  . Attends Banker Meetings: Not on file  . Marital Status: Not on file  Intimate Partner Violence:   . Fear of Current or Ex-Partner: Not on file  . Emotionally Abused: Not on file  . Physically Abused: Not on file  . Sexually Abused: Not on file    Family History:    Family History  Problem Relation Age of Onset  . Stroke Mother   . Diabetes Father   .  Heart disease Maternal Grandmother      ROS:  Please see the history of present illness.   All other ROS reviewed and negative.     Physical Exam/Data:   Vitals:   11/03/20 0800 11/03/20 0900 11/03/20 1000 11/03/20 1015  BP: (!) 154/70 (!) 167/96 (!) 151/86   Pulse: 63 (!) 52 69 60  Resp: 14 12 (!) 24 13  Temp:      TempSrc:      SpO2: 99% 98% 97% 100%  Weight:      Height:       No intake or output data in the 24 hours ending 11/03/20 1043 Last 3 Weights 11/02/2020 11/02/2020 04/12/2016  Weight (lbs) 180 lb 180 lb 173 lb  Weight (kg) 81.647 kg 81.647 kg 78.472 kg     Body mass index is 29.05 kg/m.  General:  Well nourished, well developed, in no acute distress HEENT: sclera anicteric Neck: no JVD Vascular: No carotid bruits; distal pulses 2+ bilaterally without bruits  Cardiac:  normal S1, S2; RRR; no murmurs, rubs, or gallops Lungs:  clear to auscultation bilaterally, no wheezing, rhonchi or rales  Abd: soft, nontender, no hepatomegaly  Ext: no edema Musculoskeletal:  No deformities, BUE and BLE strength normal and equal Skin: warm and dry    Neuro:  CNs 2-12 intact, no focal abnormalities noted Psych:  Normal affect   EKG:  The EKG was personally reviewed and demonstrates:  sinus rhythm with 1st degree AV block with rate 65 bpm, TWI in infero-anterolateral leads (new compared to 2017) Telemetry:  Telemetry was personally reviewed and demonstrates: sinus rhythm/sinus bradycardia  Relevant CV Studies: Echocardiogram 2013: - Left ventricle: The cavity size was normal. Wall thickness  was normal. Systolic function was vigorous. The estimated  ejection fraction was in the range of 65% to 70%.  - Pulmonary arteries: PA peak pressure: 43mm Hg (S).   Laboratory Data:  High Sensitivity Troponin:   Recent Labs  Lab 11/02/20 1243 11/02/20 1717  TROPONINIHS 4 4     Chemistry Recent Labs  Lab 11/02/20 1243  NA 138  K 4.3  CL 103  CO2 25  GLUCOSE 108*  BUN 9  CREATININE 0.89  CALCIUM 9.4  GFRNONAA >60  ANIONGAP 10    No results for input(s): PROT, ALBUMIN, AST, ALT, ALKPHOS, BILITOT in the last 168 hours. Hematology Recent Labs  Lab 11/02/20 1243  WBC 5.0  RBC 4.00  HGB 12.6  HCT 39.3  MCV 98.3  MCH 31.5  MCHC 32.1  RDW 12.9  PLT 271   BNPNo results for input(s): BNP, PROBNP in the last 168 hours.  DDimer No results for input(s): DDIMER in the last 168 hours.   Radiology/Studies:  DG Chest 2 View  Result Date: 11/02/2020 CLINICAL DATA:  Chest pain. EXAM: CHEST - 2 VIEW COMPARISON:  09/29/2014. FINDINGS: The heart size and mediastinal contours are within normal limits. Both lungs are clear. No visible pleural effusions or pneumothorax. The visualized skeletal structures are unremarkable. IMPRESSION: No active cardiopulmonary disease. Electronically Signed   By: Feliberto Harts MD   On: 11/02/2020 13:04     Assessment and Plan:   1. Chest pain: patient presented with left arm pain radiating to the chest with pressure like sensation lasting for 10-15 minutes before resolving spontaneously.  Symptom occurred at rest. No recent exertional CP or DOE. EKG does show new TWI in infero-anterolateral leads compared to 2017. HsTrop was negative x2. Risk factors for CAD include  tobacco abuse, and based on monitoring/lab work here, new diagnosis of HTN and HLD.  - Will check an echocardiogram to evaluate LV function and wall motion.  - She would be a great candidate for a coronary CTA to r/o ischemia given baseline bradycardia and normal renal function   2. New onset HTN: BP elevated this admission - Will start amlodipine   3. New onset HLD: LDL 148; triglycerides 191. Started on atorvastatin 40mg  daily - Agree with addition of atorvastatin - Will need repeat FLP/LFTs in 6-8 weeks for close monitoring.   4. 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 - Avoid AV nodal blocking agents  5. Apneic episodes: noted on telemetry overnight. She reports snoring and occasional PND.  - Would benefit from an outpatient sleep study to r/o OSA  6. Tobacco abuse: smoking 0.5 ppd for the past 33 years. She has never tried to quit in the past. Open to chantix - Continue to encourage cessation - Will defer medications at discharge to primary team      HEAR Score (for undifferentiated chest pain):  HEAR Score: 5            For questions or updates, please contact [facility] Please consult [email or web address] for contact info under    Signed, , PA-C  11/03/2020 10:43 AM

## 2020-11-03 NOTE — Progress Notes (Signed)
PROGRESS NOTE    Sarah Hunter  YDX:[id number] DOB: 11-09-1966 DOA: 11/02/2020 PCP: System, Provider Not In (Confirm with patient/family/NH records and if not entered, this HAS to be entered at [facility] point of entry. "No PCP" if truly none.)   Chief Complaint  Patient presents with  . Chest Pain    Brief Narrative:  54 year old female with PMH of hypertension, hyperlipidemia, and chronic tobacco use who presents to ED on 11/15 with chest pain.  EKG shows new T wave inversions in precordial leads.  Echo shows grade 1 diastolic heart failure with EF 55-60% and no RWMA.  CTA ordered by Cardiology is pending.   Assessment & Plan:   Principal Problem:   Chest pain Active Problems:   Elevated blood pressure reading   Tobacco use   Chest Pain: Echo shows no RWMA. - CTA is pending. - Continue Aspirin and Statin. - Discharge tomorrow.  Hypertension - Amlodipine 5 mg daily.  Hyperlipidemia - Atorvastatin 40 mg daily.  Chronic Tobacco Use: Smoked 1/2 PPD x 33 years. - Counseled on cessation. - Discharge on Chantix.  Daytime sleepiness / Apnea on telemetry - Recommend sleep study outpatient.    DVT prophylaxis: Lovenox Code Status: Full Family Communication: discussed with patient Disposition:   Status is: Observation  The patient remains OBS appropriate and will d/c before 2 midnights.  Dispo: The patient is from: Home              Anticipated d/c is to: Home              Anticipated d/c date is: 1 day              Patient currently is not medically stable to d/c.     Consultants:   Cardiology  Procedures:   CTA is pending.  Antimicrobials:   None   Subjective: Patient is lying in bed comfortably.  Denies chest pain or shortness of breath.   Objective: Vitals:   11/03/20 1400 11/03/20 1430 11/03/20 1445 11/03/20 1500  BP: (!) 144/81 (!) 158/83  (!) 151/80  Pulse: (!) 52 (!) 54 (!) 57 (!) 55  Resp: 16 13 17 13   Temp:      TempSrc:      SpO2: 97%  98% 100% 97%  Weight:      Height:       No intake or output data in the 24 hours ending 11/03/20 1605 Filed Weights   11/02/20 1239 11/02/20 2032  Weight: 81.6 kg 81.6 kg    Examination:  General exam: Appears calm and comfortable  Respiratory system: Clear to auscultation. Respiratory effort normal. Cardiovascular system: S1 & S2 heard, RRR. No JVD, murmurs, rubs, gallops or clicks. No pedal edema. Gastrointestinal system: Abdomen is nondistended, soft and nontender. No organomegaly or masses felt. Normal bowel sounds heard. Central nervous system: Alert and oriented. No focal neurological deficits. Extremities: Symmetric 5 x 5 power. Skin: No rashes, lesions or ulcers Psychiatry: Judgement and insight appear normal. Mood & affect appropriate.     Data Reviewed: I have personally reviewed following labs and imaging studies  CBC: Recent Labs  Lab 11/02/20 1243  WBC 5.0  HGB 12.6  HCT 39.3  MCV 98.3  PLT 271    Basic Metabolic Panel: Recent Labs  Lab 11/02/20 1243  NA 138  K 4.3  CL 103  CO2 25  GLUCOSE 108*  BUN 9  CREATININE 0.89  CALCIUM 9.4    GFR: Estimated Creatinine Clearance:  78.7 mL/min (by C-G formula based on SCr of 0.89 mg/dL).  Liver Function Tests: No results for input(s): AST, ALT, ALKPHOS, BILITOT, PROT, ALBUMIN in the last 168 hours.  CBG: Recent Labs  Lab 11/03/20 1204  GLUCAP 78     Recent Results (from the past 240 hour(s))  Respiratory Panel by RT PCR (Flu A&B, Covid) - Nasopharyngeal Swab     Status: None   Collection Time: 11/02/20  9:34 PM   Specimen: Nasopharyngeal Swab  Result Value Ref Range Status   SARS Coronavirus 2 by RT PCR NEGATIVE NEGATIVE Final    Comment: (NOTE) SARS-CoV-2 target nucleic acids are NOT DETECTED.  The SARS-CoV-2 RNA is generally detectable in upper respiratoy specimens during the acute phase of infection. The lowest concentration of SARS-CoV-2 viral copies this assay can detect is 131  copies/mL. A negative result does not preclude SARS-Cov-2 infection and should not be used as the sole basis for treatment or other patient management decisions. A negative result may occur with  improper specimen collection/handling, submission of specimen other than nasopharyngeal swab, presence of viral mutation(s) within the areas targeted by this assay, and inadequate number of viral copies (<131 copies/mL). A negative result must be combined with clinical observations, patient history, and epidemiological information. The expected result is Negative.  Fact Sheet for Patients:  https://www.moore.com/  Fact Sheet for Healthcare Providers:  https://www.Riester.biz/  This test is no t yet approved or cleared by the [place] FDA and  has been authorized for detection and/or diagnosis of SARS-CoV-2 by FDA under an Emergency Use Authorization (EUA). This EUA will remain  in effect (meaning this test can be used) for the duration of the COVID-19 declaration under Section 564(b)(1) of the Act, 21 U.S.C. section 360bbb-3(b)(1), unless the authorization is terminated or revoked sooner.     Influenza A by PCR NEGATIVE NEGATIVE Final   Influenza B by PCR NEGATIVE NEGATIVE Final    Comment: (NOTE) The Xpert Xpress SARS-CoV-2/FLU/RSV assay is intended as an aid in  the diagnosis of influenza from Nasopharyngeal swab specimens and  should not be used as a sole basis for treatment. Nasal washings and  aspirates are unacceptable for Xpert Xpress SARS-CoV-2/FLU/RSV  testing.  Fact Sheet for Patients: https://www.moore.com/  Fact Sheet for Healthcare Providers: https://www.Toney.biz/  This test is not yet approved or cleared by the [place] FDA and  has been authorized for detection and/or diagnosis of SARS-CoV-2 by  FDA under an Emergency Use Authorization (EUA). This EUA will remain  in effect (meaning  this test can be used) for the duration of the  Covid-19 declaration under Section 564(b)(1) of the Act, 21  U.S.C. section 360bbb-3(b)(1), unless the authorization is  terminated or revoked. Performed at [facility], [address] [place], [place] [address]        Radiology Studies: DG Chest 2 View  Result Date: 11/02/2020 CLINICAL DATA:  Chest pain. EXAM: CHEST - 2 VIEW COMPARISON:  09/29/2014. FINDINGS: The heart size and mediastinal contours are within normal limits. Both lungs are clear. No visible pleural effusions or pneumothorax. The visualized skeletal structures are unremarkable. IMPRESSION: No active cardiopulmonary disease. Electronically Signed   By: Feliberto Harts MD   On: 11/02/2020 13:04   CT CORONARY MORPH W/CTA COR W/SCORE W/CA W/CM &/OR WO/CM  Addendum Date: 11/03/2020   ADDENDUM REPORT: 11/03/2020 14:42 CLINICAL DATA:  54 Year old African American female EXAM: Cardiac/Coronary  CTA TECHNIQUE: The patient was scanned on a [employer]  Force scanner. FINDINGS: A 100 kV prospective scan was triggered in the descending thoracic aorta at 111 HU's. Axial non-contrast 3 mm slices were carried out through the heart. The data set was analyzed on a dedicated work station and scored using the Agatson method. Gantry rotation speed was 250 msecs and collimation was .6 mm. No beta blockade and 0.8 mg of sl NTG was given. The 3D data set was reconstructed in 5% intervals of the 67-82 % of the R-R cycle. Diastolic phases were analyzed on a dedicated work station using MPR, MIP and VRT modes. The patient received 80 cc of contrast. Aorta:  Normal size.  No calcifications.  No dissection. Aortic Valve:  Tri-leaflet.  No calcifications. Coronary Arteries:  Normal coronary origin.  Right dominance. Coronary calcium score of 0. This was 1st percentile for age, sex, and race matched control. RCA is a large dominant artery that gives rise to PDA and PLA. There is no plaque. Left main is a  large artery that gives rise to LAD and LCX arteries. LAD is a large vessel that gives rise to a D1 vessel and has no plaque. LCX is a non-dominant artery that gives rise to one large OM1 branch and bifurcates with a smaller OM2 branch. There is no plaque. Other findings: Abnormal pulmonary vein drainage into the left atrium (left common pulmonary vein with normal RUPV and RLPV anatomy). Normal left atrial appendage without a thrombus. Normal size of the pulmonary artery. Extra-cardiac findings: See attached radiology report for non-cardiac structures. IMPRESSION: 1. Coronary calcium score of 0. This was 1st percentile for age, sex, and race matched control. 2. Normal coronary origin with right dominance. 3. CAD-RADS 0. No evidence of CAD (0%). Consider non-atherosclerotic causes of chest pain. 4. Abnormal pulmonary vein drainage into the left atrium as above. Electronically Signed   By: Riley Lam MD   On: 11/03/2020 14:42   Result Date: 11/03/2020 EXAM: OVER-READ INTERPRETATION  CT CHEST The following report is an over-read performed by radiologist Dr. Trudie Reed of [facility] on 11/03/2020. This over-read does not include interpretation of cardiac or coronary anatomy or pathology. The coronary calcium score/coronary CTA interpretation by the cardiologist is attached. COMPARISON:  None. FINDINGS: Within the visualized portions of the thorax there are no suspicious appearing pulmonary nodules or masses, there is no acute consolidative airspace disease, no pleural effusions, no pneumothorax and no lymphadenopathy. Visualized portions of the upper abdomen are unremarkable. There are no aggressive appearing lytic or blastic lesions noted in the visualized portions of the skeleton. IMPRESSION: No significant incidental noncardiac findings are noted. Electronically Signed: By: Trudie Reed M.D. On: 11/03/2020 13:49   ECHOCARDIOGRAM COMPLETE  Result Date: 11/03/2020     ECHOCARDIOGRAM REPORT   Patient Name:   [facility] Date of Exam: 11/03/2020 Medical Rec #:  [id number]   Height:       66.0 in Accession #:    [id number]  Weight:       180.0 lb Date of Birth:  1966-03-26   BSA:          1.912 m Patient Age:    54 years    BP:           151/86 mmHg Patient Gender: F           HR:           69 bpm. Exam Location:  Inpatient Procedure: 2D Echo Indications:    Chest Pain 786.50 / R07.9  History:        Patient has prior history of Echocardiogram examinations, most                 recent 10/31/2012. Risk Factors:Current Smoker, Hypertension and                 Dyslipidemia.  Sonographer:    Leta Jungling RDCS Referring Phys: [id number] Beatriz Stallion IMPRESSIONS  1. Left ventricular ejection fraction, by estimation, is 60 to 65%. The left ventricle has normal function. The left ventricle has no regional wall motion abnormalities. Left ventricular diastolic parameters are consistent with Grade I diastolic dysfunction (impaired relaxation).  2. Right ventricular systolic function is normal. The right ventricular size is normal. Tricuspid regurgitation signal is inadequate for assessing PA pressure.  3. The mitral valve is normal in structure. Trivial mitral valve regurgitation. No evidence of mitral stenosis.  4. The aortic valve was not well visualized. Aortic valve regurgitation is trivial. No aortic stenosis is present.  5. The inferior vena cava is normal in size with greater than 50% respiratory variability, suggesting right atrial pressure of 3 mmHg. FINDINGS  Left Ventricle: Left ventricular ejection fraction, by estimation, is 60 to 65%. The left ventricle has normal function. The left ventricle has no regional wall motion abnormalities. The left ventricular internal cavity size was normal in size. There is  no left ventricular hypertrophy. Left ventricular diastolic parameters are consistent with Grade I diastolic dysfunction (impaired relaxation). Right Ventricle: The right  ventricular size is normal. No increase in right ventricular wall thickness. Right ventricular systolic function is normal. Tricuspid regurgitation signal is inadequate for assessing PA pressure. Left Atrium: Left atrial size was normal in size. Right Atrium: Right atrial size was normal in size. Pericardium: There is no evidence of pericardial effusion. Mitral Valve: The mitral valve is normal in structure. Trivial mitral valve regurgitation. No evidence of mitral valve stenosis. Tricuspid Valve: The tricuspid valve is normal in structure. Tricuspid valve regurgitation is trivial. No evidence of tricuspid stenosis. Aortic Valve: The aortic valve was not well visualized. Aortic valve regurgitation is trivial. No aortic stenosis is present. Pulmonic Valve: The pulmonic valve was not well visualized. Pulmonic valve regurgitation is not visualized. No evidence of pulmonic stenosis. Aorta: The aortic root is normal in size and structure. Venous: The inferior vena cava is normal in size with greater than 50% respiratory variability, suggesting right atrial pressure of 3 mmHg. IAS/Shunts: No atrial level shunt detected by color flow Doppler.  LEFT VENTRICLE PLAX 2D LVIDd:         4.20 cm  Diastology LVIDs:         3.30 cm  LV e' medial:    4.79 cm/s LV PW:         0.80 cm  LV E/e' medial:  15.4 LV IVS:        1.00 cm  LV e' lateral:   6.85 cm/s LVOT diam:     2.10 cm  LV E/e' lateral: 10.8 LV SV:         100 LV SV Index:   52 LVOT Area:     3.46 cm  RIGHT VENTRICLE RV S prime:     10.20 cm/s TAPSE (M-mode): 1.8 cm LEFT ATRIUM             Index       RIGHT ATRIUM          Index LA diam:  3.40 cm 1.78 cm/m  RA Area:     9.34 cm LA Vol (A2C):   32.3 ml 16.89 ml/m RA Volume:   16.70 ml 8.73 ml/m LA Vol (A4C):   31.8 ml 16.63 ml/m LA Biplane Vol: 31.9 ml 16.68 ml/m  AORTIC VALVE LVOT Vmax:   112.00 cm/s LVOT Vmean:  76.900 cm/s LVOT VTI:    0.289 m  AORTA Ao Root diam: 3.00 cm Ao Asc diam:  3.20 cm MITRAL VALVE MV  Area (PHT): 2.80 cm    SHUNTS MV Decel Time: 271 msec    Systemic VTI:  0.29 m MV E velocity: 73.70 cm/s  Systemic Diam: 2.10 cm MV A velocity: 81.80 cm/s MV E/A ratio:  0.90 Weston BrassGayatri Acharya MD Electronically signed by Weston BrassGayatri Acharya MD Signature Date/Time: 11/03/2020/12:14:08 PM    Final      Scheduled Meds: . amLODipine  5 mg Oral Daily  . aspirin EC  81 mg Oral Daily  . atorvastatin  40 mg Oral Daily  . enoxaparin (LOVENOX) injection  40 mg Subcutaneous Q24H  . metoprolol tartrate       Continuous Infusions:   LOS: 0 days    Time spent: 30 minutes    Baldwin JamaicaHannah Montana Fassnacht, MD [facility]   To contact the attending provider between 7A-7P or the covering provider during after hours 7P-7A, please log into the web site [email or web address] and access using universal [place] password for that web site. If you do not have the password, please call the hospital operator.  11/03/2020, 4:05 PM

## 2020-11-03 NOTE — ED Notes (Signed)
Pt independently ambulatory to restroom. Even steady gait, denies feeling dizzy or lightheaded

## 2020-11-04 DIAGNOSIS — Z720 Tobacco use: Secondary | ICD-10-CM | POA: Diagnosis not present

## 2020-11-04 DIAGNOSIS — I440 Atrioventricular block, first degree: Secondary | ICD-10-CM

## 2020-11-04 DIAGNOSIS — E785 Hyperlipidemia, unspecified: Secondary | ICD-10-CM

## 2020-11-04 DIAGNOSIS — R079 Chest pain, unspecified: Secondary | ICD-10-CM | POA: Diagnosis not present

## 2020-11-04 DIAGNOSIS — R030 Elevated blood-pressure reading, without diagnosis of hypertension: Secondary | ICD-10-CM | POA: Diagnosis not present

## 2020-11-04 DIAGNOSIS — I10 Essential (primary) hypertension: Secondary | ICD-10-CM

## 2020-11-04 LAB — BASIC METABOLIC PANEL
Anion gap: 8 (ref 5–15)
BUN: 10 mg/dL (ref 6–20)
CO2: 25 mmol/L (ref 22–32)
Calcium: 9.7 mg/dL (ref 8.9–10.3)
Chloride: 104 mmol/L (ref 98–111)
Creatinine, Ser: 0.9 mg/dL (ref 0.44–1.00)
GFR, Estimated: >60 mL/min (ref >60)
Glucose, Bld: 93 mg/dL (ref 70–99)
Potassium: 3.9 mmol/L (ref 3.5–5.1)
Sodium: 137 mmol/L (ref 135–145)

## 2020-11-04 LAB — HIV ANTIBODY (ROUTINE TESTING W REFLEX): HIV Screen 4th Generation wRfx: NONREACTIVE

## 2020-11-04 LAB — CBC
HCT: 41.8 % (ref 36.0–46.0)
Hemoglobin: 13.8 g/dL (ref 12.0–15.0)
MCH: 30.9 pg (ref 26.0–34.0)
MCHC: 33 g/dL (ref 30.0–36.0)
MCV: 93.7 fL (ref 80.0–100.0)
Platelets: 280 10*3/uL (ref 150–400)
RBC: 4.46 MIL/uL (ref 3.87–5.11)
RDW: 12.5 % (ref 11.5–15.5)
WBC: 5.3 10*3/uL (ref 4.0–10.5)
nRBC: 0 % (ref 0.0–0.2)

## 2020-11-04 MED ORDER — AMLODIPINE BESYLATE 5 MG PO TABS: 5.0000 mg | ORAL_TABLET | Freq: Every day | ORAL | 1 refills | Status: AC

## 2020-11-04 MED ORDER — ATORVASTATIN CALCIUM 40 MG PO TABS: 40.0000 mg | ORAL_TABLET | Freq: Every day | ORAL | 1 refills | Status: AC

## 2020-11-04 MED FILL — ATORVASTATIN CALCIUM 40 MG: 40 | 30 days supply | Qty: 30 | Fill #0

## 2020-11-04 MED FILL — AMLODIPINE BESYLATE 5 MG TA: 5 | 30 days supply | Qty: 30 | Fill #0

## 2020-11-04 NOTE — Care Management (Signed)
11-04-20 1025 Case Manager spoke with patient regarding PCP needs. Patient is aware to call her insurance company to obtain a PCP in network to make a hospital follow up appointment. Gala Lewandowsky, RN,BSN Case Manager

## 2020-11-04 NOTE — Discharge Summary (Signed)
Physician Discharge Summary  Sarah GuerinSheon Gowell ZOX:[id number] DOB: Jan 21, 1966 DOA: 11/02/2020  PCP: System, Provider Not In  Admit date: 11/02/2020 Discharge date: 11/04/2020  Admitted From: Home Disposition:  Home  Recommendations for Outpatient Follow-up:  1. Follow up with PCP in 1 week 2. Repeat LFT, lipid panel in 6 to 8 weeks 3. Recommend outpatient sleep study to rule out OSA  Discharge Condition: Stable CODE STATUS: Full  Diet recommendation: Heart healthy   Brief/Interim Summary: From H&P by Dr. Darreld McleanVishal Patel: "Sarah Hunter is a 54 y.o. female with medical history significant for tobacco use who presents to the ED for evaluation of chest pain.  Patient states she developed new onset of left upper extremity pain this morning while she was resting in bed.  The pain moved to her left chest and felt like a heavy pressure sensation.  Pain did not radiate to her neck or jaw.  She did not have any associated nausea, vomiting, diaphoresis, dyspnea, or palpitations.  She not having lightheadedness/dizziness or loss of consciousness.  Symptoms lasted about 10-15 minutes before resolving on their own.  She also reports 2 months of acid reflux/heartburn which she says feels different than her chest pressure episode.  She came to the ED for further evaluation.  Patient states she had a previous stress test approximately 20 years ago when she was living in [place] which was reportedly normal.  She reports a history of heart disease in her maternal grandmother.  She says her father side of the family has diabetes.  Both of her parents are deceased.  She is a current smoker of 0.5 pack/day since age of 54.  She reports drinking 2-3 bottles of wine per week.  She uses marijuana occasionally.  She denies any cocaine or IV drug use.   ED Course:  Initial vitals showed BP 174/93, pulse 64, RR 16, temp 98.4 Fahrenheit, SPO2 100% on room air.  Labs showed sodium 138, potassium 4.3, bicarb 25, BUN 9,  creatinine 0.89, serum glucose 108, WBC 5.0, hemoglobin 12.6, platelets 271,000, high-sensitivity troponin I x4.  2 view chest x-ray is negative for focal consolidation, edema, or effusion.  EKG showed sinus rhythm, first-degree AV block, Q waves V2, T wave inversion leads II, III, aVF, V4, V5.  When compared to prior from April 2017, T wave inversions and first-degree AV block are new.  Patient was given aspirin 324 mg.  The hospitalist service was consulted to admit for further evaluation management.  EDP discussed with on-call cardiology team will follow in a.m. for potential stress testing."  Patient was evaluated by cardiology and underwent echocardiogram which was negative for regional wall motion abnormality as well as CT coronary which were negative.  Chest pain resolved.  Chest pain was felt to be secondary to musculoskeletal etiology.   Discharge Diagnoses:  Principal Problem:   Chest pain Active Problems:   Elevated blood pressure reading   Tobacco use   Essential hypertension   Hyperlipidemia   First degree AV block   Discharge Instructions  Discharge Instructions    Call MD for:  difficulty breathing, headache or visual disturbances   Complete by: As directed    Call MD for:  extreme fatigue   Complete by: As directed    Call MD for:  persistant dizziness or light-headedness   Complete by: As directed    Call MD for:  persistant nausea and vomiting   Complete by: As directed    Call MD for:  severe uncontrolled  pain   Complete by: As directed    Call MD for:  temperature >100.4   Complete by: As directed    Discharge instructions   Complete by: As directed    You were cared for by a hospitalist during your hospital stay. If you have any questions about your discharge medications or the care you received while you were in the hospital after you are discharged, you can call the unit and ask to speak with the hospitalist on call if the hospitalist that took care of  you is not available. Once you are discharged, your primary care physician will handle any further medical issues. Please note that NO REFILLS for any discharge medications will be authorized once you are discharged, as it is imperative that you return to your primary care physician (or establish a relationship with a primary care physician if you do not have one) for your aftercare needs so that they can reassess your need for medications and monitor your lab values.   Increase activity slowly   Complete by: As directed      Allergies as of 11/04/2020      Reactions   Iodine Swelling   Facial swelling   Shrimp [shellfish Allergy] Swelling   Facial swelling      Medication List    TAKE these medications   amLODipine 5 MG tablet Commonly known as: NORVASC Take 1 tablet (5 mg total) by mouth daily. Start taking on: November 05, 2020   atorvastatin 40 MG tablet Commonly known as: LIPITOR Take 1 tablet (40 mg total) by mouth daily. Start taking on: November 05, 2020   BIOTIN PO Take 2 tablets by mouth daily.   multivitamin with minerals Tabs tablet Take 1 tablet by mouth daily.   naproxen sodium 220 MG tablet Commonly known as: ALEVE Take 440 mg by mouth 2 (two) times daily as needed (pain/headache).   OVER THE COUNTER MEDICATION Take 2 capsules by mouth daily. Sea Moss   OVER THE COUNTER MEDICATION Take 2 capsules by mouth daily. Beet Root       Allergies  Allergen Reactions  . Iodine Swelling    Facial swelling  . Shrimp [Shellfish Allergy] Swelling    Facial swelling    Consultations:  Cardiology    Procedures/Studies: DG Chest 2 View  Result Date: 11/02/2020 CLINICAL DATA:  Chest pain. EXAM: CHEST - 2 VIEW COMPARISON:  09/29/2014. FINDINGS: The heart size and mediastinal contours are within normal limits. Both lungs are clear. No visible pleural effusions or pneumothorax. The visualized skeletal structures are unremarkable. IMPRESSION: No active  cardiopulmonary disease. Electronically Signed   By: Feliberto Harts MD   On: 11/02/2020 13:04   CT CORONARY MORPH W/CTA COR W/SCORE W/CA W/CM &/OR WO/CM  Addendum Date: 11/03/2020   ADDENDUM REPORT: 11/03/2020 14:42 CLINICAL DATA:  54 Year old African American female EXAM: Cardiac/Coronary  CTA TECHNIQUE: The patient was scanned on a [employer]. FINDINGS: A 100 kV prospective scan was triggered in the descending thoracic aorta at 111 HU's. Axial non-contrast 3 mm slices were carried out through the heart. The data set was analyzed on a dedicated work station and scored using the Agatson method. Gantry rotation speed was 250 msecs and collimation was .6 mm. No beta blockade and 0.8 mg of sl NTG was given. The 3D data set was reconstructed in 5% intervals of the 67-82 % of the R-R cycle. Diastolic phases were analyzed on a dedicated work station using MPR, MIP  and VRT modes. The patient received 80 cc of contrast. Aorta:  Normal size.  No calcifications.  No dissection. Aortic Valve:  Tri-leaflet.  No calcifications. Coronary Arteries:  Normal coronary origin.  Right dominance. Coronary calcium score of 0. This was 1st percentile for age, sex, and race matched control. RCA is a large dominant artery that gives rise to PDA and PLA. There is no plaque. Left main is a large artery that gives rise to LAD and LCX arteries. LAD is a large vessel that gives rise to a D1 vessel and has no plaque. LCX is a non-dominant artery that gives rise to one large OM1 branch and bifurcates with a smaller OM2 branch. There is no plaque. Other findings: Abnormal pulmonary vein drainage into the left atrium (left common pulmonary vein with normal RUPV and RLPV anatomy). Normal left atrial appendage without a thrombus. Normal size of the pulmonary artery. Extra-cardiac findings: See attached radiology report for non-cardiac structures. IMPRESSION: 1. Coronary calcium score of 0. This was 1st percentile for age, sex, and  race matched control. 2. Normal coronary origin with right dominance. 3. CAD-RADS 0. No evidence of CAD (0%). Consider non-atherosclerotic causes of chest pain. 4. Abnormal pulmonary vein drainage into the left atrium as above. Electronically Signed   By: Riley Lam MD   On: 11/03/2020 14:42   Result Date: 11/03/2020 EXAM: OVER-READ INTERPRETATION  CT CHEST The following report is an over-read performed by radiologist Dr. Trudie Reed of [facility] on 11/03/2020. This over-read does not include interpretation of cardiac or coronary anatomy or pathology. The coronary calcium score/coronary CTA interpretation by the cardiologist is attached. COMPARISON:  None. FINDINGS: Within the visualized portions of the thorax there are no suspicious appearing pulmonary nodules or masses, there is no acute consolidative airspace disease, no pleural effusions, no pneumothorax and no lymphadenopathy. Visualized portions of the upper abdomen are unremarkable. There are no aggressive appearing lytic or blastic lesions noted in the visualized portions of the skeleton. IMPRESSION: No significant incidental noncardiac findings are noted. Electronically Signed: By: Trudie Reed M.D. On: 11/03/2020 13:49   ECHOCARDIOGRAM COMPLETE  Result Date: 11/03/2020    ECHOCARDIOGRAM REPORT   Patient Name:   [facility] Date of Exam: 11/03/2020 Medical Rec #:  [id number]   Height:       66.0 in Accession #:    [id number]  Weight:       180.0 lb Date of Birth:  [birth year]   BSA:          1.912 m Patient Age:    54 years    BP:           151/86 mmHg Patient Gender: F           HR:           69 bpm. Exam Location:  Inpatient Procedure: 2D Echo Indications:    Chest Pain 786.50 / R07.9  History:        Patient has prior history of Echocardiogram examinations, most                 recent 10/31/2012. Risk Factors:Current Smoker, Hypertension and                 Dyslipidemia.  Sonographer:    Leta Jungling RDCS  Referring Phys: [id number] Beatriz Stallion IMPRESSIONS  1. Left ventricular ejection fraction, by estimation, is 60 to 65%. The left ventricle has normal function. The left ventricle has no regional  wall motion abnormalities. Left ventricular diastolic parameters are consistent with Grade I diastolic dysfunction (impaired relaxation).  2. Right ventricular systolic function is normal. The right ventricular size is normal. Tricuspid regurgitation signal is inadequate for assessing PA pressure.  3. The mitral valve is normal in structure. Trivial mitral valve regurgitation. No evidence of mitral stenosis.  4. The aortic valve was not well visualized. Aortic valve regurgitation is trivial. No aortic stenosis is present.  5. The inferior vena cava is normal in size with greater than 50% respiratory variability, suggesting right atrial pressure of 3 mmHg. FINDINGS  Left Ventricle: Left ventricular ejection fraction, by estimation, is 60 to 65%. The left ventricle has normal function. The left ventricle has no regional wall motion abnormalities. The left ventricular internal cavity size was normal in size. There is  no left ventricular hypertrophy. Left ventricular diastolic parameters are consistent with Grade I diastolic dysfunction (impaired relaxation). Right Ventricle: The right ventricular size is normal. No increase in right ventricular wall thickness. Right ventricular systolic function is normal. Tricuspid regurgitation signal is inadequate for assessing PA pressure. Left Atrium: Left atrial size was normal in size. Right Atrium: Right atrial size was normal in size. Pericardium: There is no evidence of pericardial effusion. Mitral Valve: The mitral valve is normal in structure. Trivial mitral valve regurgitation. No evidence of mitral valve stenosis. Tricuspid Valve: The tricuspid valve is normal in structure. Tricuspid valve regurgitation is trivial. No evidence of tricuspid stenosis. Aortic Valve: The aortic  valve was not well visualized. Aortic valve regurgitation is trivial. No aortic stenosis is present. Pulmonic Valve: The pulmonic valve was not well visualized. Pulmonic valve regurgitation is not visualized. No evidence of pulmonic stenosis. Aorta: The aortic root is normal in size and structure. Venous: The inferior vena cava is normal in size with greater than 50% respiratory variability, suggesting right atrial pressure of 3 mmHg. IAS/Shunts: No atrial level shunt detected by color flow Doppler.  LEFT VENTRICLE PLAX 2D LVIDd:         4.20 cm  Diastology LVIDs:         3.30 cm  LV e' medial:    4.79 cm/s LV PW:         0.80 cm  LV E/e' medial:  15.4 LV IVS:        1.00 cm  LV e' lateral:   6.85 cm/s LVOT diam:     2.10 cm  LV E/e' lateral: 10.8 LV SV:         100 LV SV Index:   52 LVOT Area:     3.46 cm  RIGHT VENTRICLE RV S prime:     10.20 cm/s TAPSE (M-mode): 1.8 cm LEFT ATRIUM             Index       RIGHT ATRIUM          Index LA diam:        3.40 cm 1.78 cm/m  RA Area:     9.34 cm LA Vol (A2C):   32.3 ml 16.89 ml/m RA Volume:   16.70 ml 8.73 ml/m LA Vol (A4C):   31.8 ml 16.63 ml/m LA Biplane Vol: 31.9 ml 16.68 ml/m  AORTIC VALVE LVOT Vmax:   112.00 cm/s LVOT Vmean:  76.900 cm/s LVOT VTI:    0.289 m  AORTA Ao Root diam: 3.00 cm Ao Asc diam:  3.20 cm MITRAL VALVE MV Area (PHT): 2.80 cm    SHUNTS MV Decel Time: 271  msec    Systemic VTI:  0.29 m MV E velocity: 73.70 cm/s  Systemic Diam: 2.10 cm MV A velocity: 81.80 cm/s MV E/A ratio:  0.90 Weston Brass MD Electronically signed by Weston Brass MD Signature Date/Time: 11/03/2020/12:14:08 PM    Final        Discharge Exam: Vitals:   11/04/20 0437 11/04/20 0743  BP: 128/72 127/80  Pulse: 62 64  Resp: 17   Temp: 97.7 F (36.5 C)   SpO2: 96% 100%    General: Pt is alert, awake, not in acute distress Cardiovascular: RRR, S1/S2 +, no edema Respiratory: CTA bilaterally, no wheezing, no rhonchi, no respiratory distress, no conversational  dyspnea  Abdominal: Soft, NT, ND, bowel sounds + Extremities: no edema, no cyanosis Psych: Normal mood and affect, stable judgement and insight     The results of significant diagnostics from this hospitalization (including imaging, microbiology, ancillary and laboratory) are listed below for reference.     Microbiology: Recent Results (from the past 240 hour(s))  Respiratory Panel by RT PCR (Flu A&B, Covid) - Nasopharyngeal Swab     Status: None   Collection Time: 11/02/20  9:34 PM   Specimen: Nasopharyngeal Swab  Result Value Ref Range Status   SARS Coronavirus 2 by RT PCR NEGATIVE NEGATIVE Final    Comment: (NOTE) SARS-CoV-2 target nucleic acids are NOT DETECTED.  The SARS-CoV-2 RNA is generally detectable in upper respiratoy specimens during the acute phase of infection. The lowest concentration of SARS-CoV-2 viral copies this assay can detect is 131 copies/mL. A negative result does not preclude SARS-Cov-2 infection and should not be used as the sole basis for treatment or other patient management decisions. A negative result may occur with  improper specimen collection/handling, submission of specimen other than nasopharyngeal swab, presence of viral mutation(s) within the areas targeted by this assay, and inadequate number of viral copies (<131 copies/mL). A negative result must be combined with clinical observations, patient history, and epidemiological information. The expected result is Negative.  Fact Sheet for Patients:  https://www.moore.com/  Fact Sheet for Healthcare Providers:  https://www.Fehnel.biz/  This test is no t yet approved or cleared by the [place] FDA and  has been authorized for detection and/or diagnosis of SARS-CoV-2 by FDA under an Emergency Use Authorization (EUA). This EUA will remain  in effect (meaning this test can be used) for the duration of the COVID-19 declaration under Section 564(b)(1) of  the Act, 21 U.S.C. section 360bbb-3(b)(1), unless the authorization is terminated or revoked sooner.     Influenza A by PCR NEGATIVE NEGATIVE Final   Influenza B by PCR NEGATIVE NEGATIVE Final    Comment: (NOTE) The Xpert Xpress SARS-CoV-2/FLU/RSV assay is intended as an aid in  the diagnosis of influenza from Nasopharyngeal swab specimens and  should not be used as a sole basis for treatment. Nasal washings and  aspirates are unacceptable for Xpert Xpress SARS-CoV-2/FLU/RSV  testing.  Fact Sheet for Patients: https://www.moore.com/  Fact Sheet for Healthcare Providers: https://www.Thayer.biz/  This test is not yet approved or cleared by the [place] FDA and  has been authorized for detection and/or diagnosis of SARS-CoV-2 by  FDA under an Emergency Use Authorization (EUA). This EUA will remain  in effect (meaning this test can be used) for the duration of the  Covid-19 declaration under Section 564(b)(1) of the Act, 21  U.S.C. section 360bbb-3(b)(1), unless the authorization is  terminated or revoked. Performed at [facility], [address], [place],  [place] [address]      Labs: BNP (last 3 results) No results for input(s): BNP in the last 8760 hours. Basic Metabolic Panel: Recent Labs  Lab 11/02/20 1243 11/04/20 0252  NA 138 137  K 4.3 3.9  CL 103 104  CO2 25 25  GLUCOSE 108* 93  BUN 9 10  CREATININE 0.89 0.90  CALCIUM 9.4 9.7   Liver Function Tests: No results for input(s): AST, ALT, ALKPHOS, BILITOT, PROT, ALBUMIN in the last 168 hours. No results for input(s): LIPASE, AMYLASE in the last 168 hours. No results for input(s): AMMONIA in the last 168 hours. CBC: Recent Labs  Lab 11/02/20 1243 11/04/20 0252  WBC 5.0 5.3  HGB 12.6 13.8  HCT 39.3 41.8  MCV 98.3 93.7  PLT 271 280   Cardiac Enzymes: No results for input(s): CKTOTAL, CKMB, CKMBINDEX, TROPONINI in the last 168 hours. BNP: Invalid input(s):  POCBNP CBG: Recent Labs  Lab 11/03/20 1204  GLUCAP 78   D-Dimer No results for input(s): DDIMER in the last 72 hours. Hgb A1c Recent Labs    11/03/20 0500  HGBA1C 5.4   Lipid Profile Recent Labs    11/03/20 0500  CHOL 230*  HDL 44  LDLCALC 148*  TRIG 191*  CHOLHDL 5.2   Thyroid function studies No results for input(s): TSH, T4TOTAL, T3FREE, THYROIDAB in the last 72 hours.  Invalid input(s): FREET3 Anemia work up No results for input(s): VITAMINB12, FOLATE, FERRITIN, TIBC, IRON, RETICCTPCT in the last 72 hours. Urinalysis    Component Value Date/Time   COLORURINE YELLOW 04/12/2016 1416   APPEARANCEUR CLOUDY (A) 04/12/2016 1416   LABSPEC 1.040 (H) 04/12/2016 1416   PHURINE 5.5 04/12/2016 1416   GLUCOSEU NEGATIVE 04/12/2016 1416   HGBUR TRACE (A) 04/12/2016 1416   BILIRUBINUR NEGATIVE 04/12/2016 1416   KETONESUR NEGATIVE 04/12/2016 1416   PROTEINUR NEGATIVE 04/12/2016 1416   UROBILINOGEN 0.2 10/31/2012 0009   NITRITE NEGATIVE 04/12/2016 1416   LEUKOCYTESUR NEGATIVE 04/12/2016 1416   Sepsis Labs Invalid input(s): PROCALCITONIN,  WBC,  LACTICIDVEN Microbiology Recent Results (from the past 240 hour(s))  Respiratory Panel by RT PCR (Flu A&B, Covid) - Nasopharyngeal Swab     Status: None   Collection Time: 11/02/20  9:34 PM   Specimen: Nasopharyngeal Swab  Result Value Ref Range Status   SARS Coronavirus 2 by RT PCR NEGATIVE NEGATIVE Final    Comment: (NOTE) SARS-CoV-2 target nucleic acids are NOT DETECTED.  The SARS-CoV-2 RNA is generally detectable in upper respiratoy specimens during the acute phase of infection. The lowest concentration of SARS-CoV-2 viral copies this assay can detect is 131 copies/mL. A negative result does not preclude SARS-Cov-2 infection and should not be used as the sole basis for treatment or other patient management decisions. A negative result may occur with  improper specimen collection/handling, submission of specimen  other than nasopharyngeal swab, presence of viral mutation(s) within the areas targeted by this assay, and inadequate number of viral copies (<131 copies/mL). A negative result must be combined with clinical observations, patient history, and epidemiological information. The expected result is Negative.  Fact Sheet for Patients:  https://www.moore.com/  Fact Sheet for Healthcare Providers:  https://www.Seiter.biz/  This test is no t yet approved or cleared by the [place] FDA and  has been authorized for detection and/or diagnosis of SARS-CoV-2 by FDA under an Emergency Use Authorization (EUA). This EUA will remain  in effect (meaning this test can be used) for the duration of the COVID-19 declaration under  Section 564(b)(1) of the Act, 21 U.S.C. section 360bbb-3(b)(1), unless the authorization is terminated or revoked sooner.     Influenza A by PCR NEGATIVE NEGATIVE Final   Influenza B by PCR NEGATIVE NEGATIVE Final    Comment: (NOTE) The Xpert Xpress SARS-CoV-2/FLU/RSV assay is intended as an aid in  the diagnosis of influenza from Nasopharyngeal swab specimens and  should not be used as a sole basis for treatment. Nasal washings and  aspirates are unacceptable for Xpert Xpress SARS-CoV-2/FLU/RSV  testing.  Fact Sheet for Patients: https://www.moore.com/  Fact Sheet for Healthcare Providers: https://www.Cullinan.biz/  This test is not yet approved or cleared by the [place] FDA and  has been authorized for detection and/or diagnosis of SARS-CoV-2 by  FDA under an Emergency Use Authorization (EUA). This EUA will remain  in effect (meaning this test can be used) for the duration of the  Covid-19 declaration under Section 564(b)(1) of the Act, 21  U.S.C. section 360bbb-3(b)(1), unless the authorization is  terminated or revoked. Performed at [facility], [address],  [place], [place] [address]      Patient was seen and examined on the day of discharge and was found to be in stable condition. Time coordinating discharge: 35 minutes including assessment and coordination of care, as well as examination of the patient.   SIGNED:  Noralee Stain, DO [facility] 11/04/2020, 10:20 AM   '

## 2020-11-04 NOTE — Progress Notes (Addendum)
The patient has been seen in conjunction with Geoffry Paradise, NP. All aspects of care have been considered and discussed. The patient has been personally interviewed, examined, and all clinical data has been reviewed.   All data reviewed.  Long discussion with patient concerning risk factor modification to avoid difficulty in the future which should include smoking cessation, blood pressure control to less than 130/80 mmHg, and LDL cholesterol less than 161 mg/dL.  Okay to discharge from our standpoint.  Perhaps arm and chest discomfort or musculoskeletal or inflammatory.  No further cardiac evaluation is felt indicated.   Progress Note  Patient Name: Sarah Hunter Date of Encounter: 11/04/2020  [facility] Cardiologist: No primary care provider on file.   Subjective   No chest pain this morning.   Inpatient Medications    Scheduled Meds: . amLODipine  5 mg Oral Daily  . aspirin EC  81 mg Oral Daily  . atorvastatin  40 mg Oral Daily  . enoxaparin (LOVENOX) injection  40 mg Subcutaneous Q24H  . influenza vac split quadrivalent PF  0.5 mL Intramuscular Tomorrow-1000   Continuous Infusions:  PRN Meds: acetaminophen, nitroGLYCERIN, ondansetron (ZOFRAN) IV   Vital Signs    Vitals:   11/03/20 1701 11/03/20 1957 11/04/20 0437 11/04/20 0743  BP: (!) 163/85 (!) 159/90 128/72 127/80  Pulse: (!) 59 (!) 59 62 64  Resp:  18 17   Temp: 98.3 F (36.8 C) 98.1 F (36.7 C) 97.7 F (36.5 C)   TempSrc: Oral Oral Oral   SpO2: 100% 96% 96% 100%  Weight: 80.4 kg  79.5 kg   Height:  (1.676 m)       Intake/Output Summary (Last 24 hours) at 11/04/2020 0904 Last data filed at 11/03/2020 2000 Gross per 24 hour  Intake 360 ml  Output --  Net 360 ml   Last 3 Weights 11/04/2020 11/03/2020 11/02/2020  Weight (lbs) 175 lb 4.8 oz 177 lb 3.2 oz 180 lb  Weight (kg) 79.516 kg 80.377 kg 81.647 kg      Telemetry    SR - Personally Reviewed  ECG    No new tracing  noted  Physical Exam  Pleasant female, sitting up in bed GEN: No acute distress.   Neck: No JVD Cardiac: RRR, no murmurs, rubs, or gallops.  Respiratory: Clear to auscultation bilaterally. GI: Soft, nontender, non-distended  MS: No edema; No deformity. Neuro:  Nonfocal  Psych: Normal affect   Labs    High Sensitivity Troponin:   Recent Labs  Lab 11/02/20 1243 11/02/20 1717  TROPONINIHS 4 4      Chemistry Recent Labs  Lab 11/02/20 1243 11/04/20 0252  NA 138 137  K 4.3 3.9  CL 103 104  CO2 25 25  GLUCOSE 108* 93  BUN 9 10  CREATININE 0.89 0.90  CALCIUM 9.4 9.7  GFRNONAA >60 >60  ANIONGAP 10 8     Hematology Recent Labs  Lab 11/02/20 1243 11/04/20 0252  WBC 5.0 5.3  RBC 4.00 4.46  HGB 12.6 13.8  HCT 39.3 41.8  MCV 98.3 93.7  MCH 31.5 30.9  MCHC 32.1 33.0  RDW 12.9 12.5  PLT 271 280    BNPNo results for input(s): BNP, PROBNP in the last 168 hours.   DDimer No results for input(s): DDIMER in the last 168 hours.   Radiology    DG Chest 2 View  Result Date: 11/02/2020 CLINICAL DATA:  Chest pain. EXAM: CHEST - 2 VIEW COMPARISON:  09/29/2014. FINDINGS: The  heart size and mediastinal contours are within normal limits. Both lungs are clear. No visible pleural effusions or pneumothorax. The visualized skeletal structures are unremarkable. IMPRESSION: No active cardiopulmonary disease. Electronically Signed   By: Feliberto Harts MD   On: 11/02/2020 13:04   CT CORONARY MORPH W/CTA COR W/SCORE W/CA W/CM &/OR WO/CM  Addendum Date: 11/03/2020   ADDENDUM REPORT: 11/03/2020 14:42 CLINICAL DATA:  54 Year old African American female EXAM: Cardiac/Coronary  CTA TECHNIQUE: The patient was scanned on a [employer]. FINDINGS: A 100 kV prospective scan was triggered in the descending thoracic aorta at 111 HU's. Axial non-contrast 3 mm slices were carried out through the heart. The data set was analyzed on a dedicated work station and scored using the Agatson  method. Gantry rotation speed was 250 msecs and collimation was .6 mm. No beta blockade and 0.8 mg of sl NTG was given. The 3D data set was reconstructed in 5% intervals of the 67-82 % of the R-R cycle. Diastolic phases were analyzed on a dedicated work station using MPR, MIP and VRT modes. The patient received 80 cc of contrast. Aorta:  Normal size.  No calcifications.  No dissection. Aortic Valve:  Tri-leaflet.  No calcifications. Coronary Arteries:  Normal coronary origin.  Right dominance. Coronary calcium score of 0. This was 1st percentile for age, sex, and race matched control. RCA is a large dominant artery that gives rise to PDA and PLA. There is no plaque. Left main is a large artery that gives rise to LAD and LCX arteries. LAD is a large vessel that gives rise to a D1 vessel and has no plaque. LCX is a non-dominant artery that gives rise to one large OM1 branch and bifurcates with a smaller OM2 branch. There is no plaque. Other findings: Abnormal pulmonary vein drainage into the left atrium (left common pulmonary vein with normal RUPV and RLPV anatomy). Normal left atrial appendage without a thrombus. Normal size of the pulmonary artery. Extra-cardiac findings: See attached radiology report for non-cardiac structures. IMPRESSION: 1. Coronary calcium score of 0. This was 1st percentile for age, sex, and race matched control. 2. Normal coronary origin with right dominance. 3. CAD-RADS 0. No evidence of CAD (0%). Consider non-atherosclerotic causes of chest pain. 4. Abnormal pulmonary vein drainage into the left atrium as above. Electronically Signed   By: Riley Lam MD   On: 11/03/2020 14:42   Result Date: 11/03/2020 EXAM: OVER-READ INTERPRETATION  CT CHEST The following report is an over-read performed by radiologist Dr. Trudie Reed of [facility] on 11/03/2020. This over-read does not include interpretation of cardiac or coronary anatomy or pathology. The coronary calcium  score/coronary CTA interpretation by the cardiologist is attached. COMPARISON:  None. FINDINGS: Within the visualized portions of the thorax there are no suspicious appearing pulmonary nodules or masses, there is no acute consolidative airspace disease, no pleural effusions, no pneumothorax and no lymphadenopathy. Visualized portions of the upper abdomen are unremarkable. There are no aggressive appearing lytic or blastic lesions noted in the visualized portions of the skeleton. IMPRESSION: No significant incidental noncardiac findings are noted. Electronically Signed: By: Trudie Reed M.D. On: 11/03/2020 13:49   ECHOCARDIOGRAM COMPLETE  Result Date: 11/03/2020    ECHOCARDIOGRAM REPORT   Patient Name:   [facility] Date of Exam: 11/03/2020 Medical Rec #:  [id number]   Height:       66.0 in Accession #:    [id number]  Weight:  180.0 lb Date of Birth:  1966-08-10   BSA:          1.912 m Patient Age:    54 years    BP:           151/86 mmHg Patient Gender: F           HR:           69 bpm. Exam Location:  Inpatient Procedure: 2D Echo Indications:    Chest Pain 786.50 / R07.9  History:        Patient has prior history of Echocardiogram examinations, most                 recent 10/31/2012. Risk Factors:Current Smoker, Hypertension and                 Dyslipidemia.  Sonographer:    Leta Jungling RDCS Referring Phys: [id number] Beatriz Stallion IMPRESSIONS  1. Left ventricular ejection fraction, by estimation, is 60 to 65%. The left ventricle has normal function. The left ventricle has no regional wall motion abnormalities. Left ventricular diastolic parameters are consistent with Grade I diastolic dysfunction (impaired relaxation).  2. Right ventricular systolic function is normal. The right ventricular size is normal. Tricuspid regurgitation signal is inadequate for assessing PA pressure.  3. The mitral valve is normal in structure. Trivial mitral valve regurgitation. No evidence of mitral stenosis.  4. The  aortic valve was not well visualized. Aortic valve regurgitation is trivial. No aortic stenosis is present.  5. The inferior vena cava is normal in size with greater than 50% respiratory variability, suggesting right atrial pressure of 3 mmHg. FINDINGS  Left Ventricle: Left ventricular ejection fraction, by estimation, is 60 to 65%. The left ventricle has normal function. The left ventricle has no regional wall motion abnormalities. The left ventricular internal cavity size was normal in size. There is  no left ventricular hypertrophy. Left ventricular diastolic parameters are consistent with Grade I diastolic dysfunction (impaired relaxation). Right Ventricle: The right ventricular size is normal. No increase in right ventricular wall thickness. Right ventricular systolic function is normal. Tricuspid regurgitation signal is inadequate for assessing PA pressure. Left Atrium: Left atrial size was normal in size. Right Atrium: Right atrial size was normal in size. Pericardium: There is no evidence of pericardial effusion. Mitral Valve: The mitral valve is normal in structure. Trivial mitral valve regurgitation. No evidence of mitral valve stenosis. Tricuspid Valve: The tricuspid valve is normal in structure. Tricuspid valve regurgitation is trivial. No evidence of tricuspid stenosis. Aortic Valve: The aortic valve was not well visualized. Aortic valve regurgitation is trivial. No aortic stenosis is present. Pulmonic Valve: The pulmonic valve was not well visualized. Pulmonic valve regurgitation is not visualized. No evidence of pulmonic stenosis. Aorta: The aortic root is normal in size and structure. Venous: The inferior vena cava is normal in size with greater than 50% respiratory variability, suggesting right atrial pressure of 3 mmHg. IAS/Shunts: No atrial level shunt detected by color flow Doppler.  LEFT VENTRICLE PLAX 2D LVIDd:         4.20 cm  Diastology LVIDs:         3.30 cm  LV e' medial:    4.79 cm/s LV  PW:         0.80 cm  LV E/e' medial:  15.4 LV IVS:        1.00 cm  LV e' lateral:   6.85 cm/s LVOT diam:     2.10 cm  LV E/e' lateral: 10.8 LV SV:         100 LV SV Index:   52 LVOT Area:     3.46 cm  RIGHT VENTRICLE RV S prime:     10.20 cm/s TAPSE (M-mode): 1.8 cm LEFT ATRIUM             Index       RIGHT ATRIUM          Index LA diam:        3.40 cm 1.78 cm/m  RA Area:     9.34 cm LA Vol (A2C):   32.3 ml 16.89 ml/m RA Volume:   16.70 ml 8.73 ml/m LA Vol (A4C):   31.8 ml 16.63 ml/m LA Biplane Vol: 31.9 ml 16.68 ml/m  AORTIC VALVE LVOT Vmax:   112.00 cm/s LVOT Vmean:  76.900 cm/s LVOT VTI:    0.289 m  AORTA Ao Root diam: 3.00 cm Ao Asc diam:  3.20 cm MITRAL VALVE MV Area (PHT): 2.80 cm    SHUNTS MV Decel Time: 271 msec    Systemic VTI:  0.29 m MV E velocity: 73.70 cm/s  Systemic Diam: 2.10 cm MV A velocity: 81.80 cm/s MV E/A ratio:  0.90 Weston Brass MD Electronically signed by Weston Brass MD Signature Date/Time: 11/03/2020/12:14:08 PM    Final     Cardiac Studies   Echo: 11/03/20  IMPRESSIONS    1. Left ventricular ejection fraction, by estimation, is 60 to 65%. The  left ventricle has normal function. The left ventricle has no regional  wall motion abnormalities. Left ventricular diastolic parameters are  consistent with Grade I diastolic  dysfunction (impaired relaxation).  2. Right ventricular systolic function is normal. The right ventricular  size is normal. Tricuspid regurgitation signal is inadequate for assessing  PA pressure.  3. The mitral valve is normal in structure. Trivial mitral valve  regurgitation. No evidence of mitral stenosis.  4. The aortic valve was not well visualized. Aortic valve regurgitation  is trivial. No aortic stenosis is present.  5. The inferior vena cava is normal in size with greater than 50%  respiratory variability, suggesting right atrial pressure of 3 mmHg.   Patient Profile     54 y.o. female with PMH of tobacco use who  presented with chest pain.   Assessment & Plan    1. Chest pain: patient presented with left arm pain radiating to the chest with pressure like sensation lasting for 10-15 minutes before resolving spontaneously. HsTrop was negative x2. CCTA was normal with Ca+ score of 0. Echo with normal EF and no rWMA noted.    2. New onset HTN: BP was elevated on admission. Significantly improved with the addition of amlodipine 5mg  daily.    3. New onset HLD: LDL 148; triglycerides 191. Started on atorvastatin 40mg  daily this admission.  -- Will need repeat FLP/LFTs in 6-8 weeks for close monitoring.   4. 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 -- Avoid AV nodal blocking agents  5. Apneic episodes: noted on telemetry overnight. She reports snoring and occasional PND.  -- Would benefit from an outpatient sleep study to r/o OSA  6. Tobacco abuse: smoking 0.5 ppd for the past 33 years. She has never tried to quit in the past. Open to chantix -- Continue to encourage cessation  Encouraged to establish with PCP at the time of discharge.   [facility] will sign off.   Medication Recommendations:  Noted above Other recommendations (  labs, testing, etc):  none Follow up as an outpatient:  Follow up with PCP  For questions or updates, please contact [facility] Please consult [email or web address] for contact info under        Signed, Sarah PageLindsay Roberts, NP  11/04/2020, 9:04 AM

## 2020-11-04 NOTE — Discharge Instructions (Signed)
Information about your medication: Statin (cholesterol-lowering agent)  Generic Name (Brand): atorvastatin (Lipitor), pravastatin (Pravachol), rosuvastatin (Crestor), simvastatin (Zocor)  PURPOSE: You are taking this medication to lower your "bad" cholesterol (LDL) and to prevent heart attacks and strokes. Statins can also raise your "good" cholesterol (HDL).  Common SIDE EFFECTS you may experience include: muscle pain or weakness (especially in the legs) and upset stomach.  Take your medication exactly as prescribed. Do not eat large amounts of grapefruit or grapefruit juice while taking this medication.  Contact your health care provider if you experience: severe muscle pain that does not improve, dark urine, or yellowing of your skin or eyes. ----------------------------------------------------------------------------------------------------------------------

## 2020-11-19 DIAGNOSIS — R9431 Abnormal electrocardiogram [ECG] [EKG]: Secondary | ICD-10-CM

## 2022-04-15 IMAGING — CT CT HEART MORP W/ CTA COR W/ SCORE W/ CA W/CM &/OR W/O CM
1 series · 6 of 9 positions shown, 8 images · non-contrast
Comparison: None.
COMPARISON: None.

Addendum:
EXAM:
OVER-READ INTERPRETATION  CT CHEST

The following report is an over-read performed by radiologist Dr.
Muruthi Buso [REDACTED] on 11/03/2020. This
over-read does not include interpretation of cardiac or coronary
anatomy or pathology. The coronary calcium score/coronary CTA
interpretation by the cardiologist is attached.
CLINICAL DATA: 54 Year old African American female
Cardiac/Coronary  CTA
TECHNIQUE: The patient was scanned on a [employer] scanner.

[Series 1235: — · 6 of 9 slices shown, 8 images]
[im 2/9  vessel]
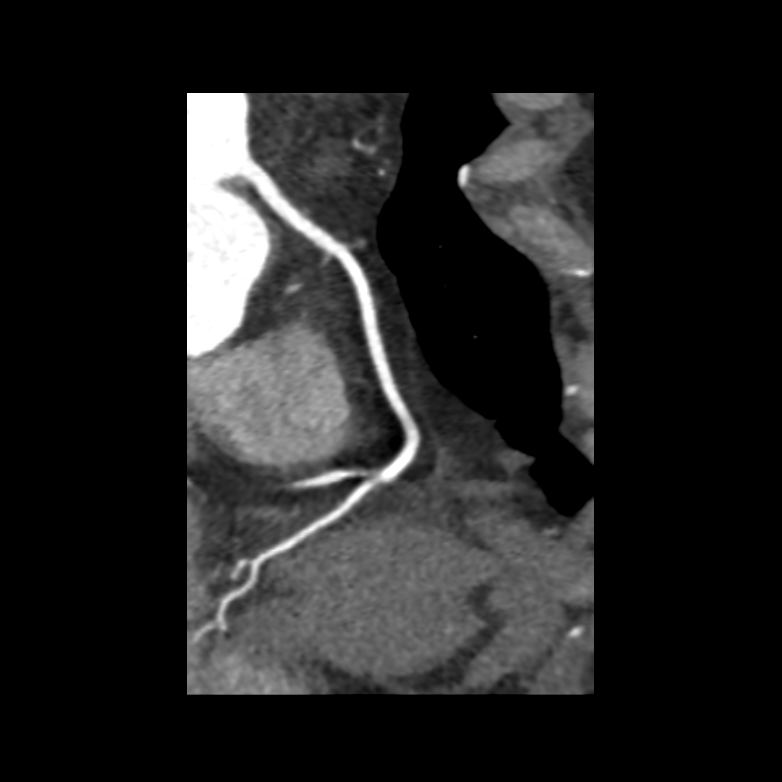
[im 2/9  lung]
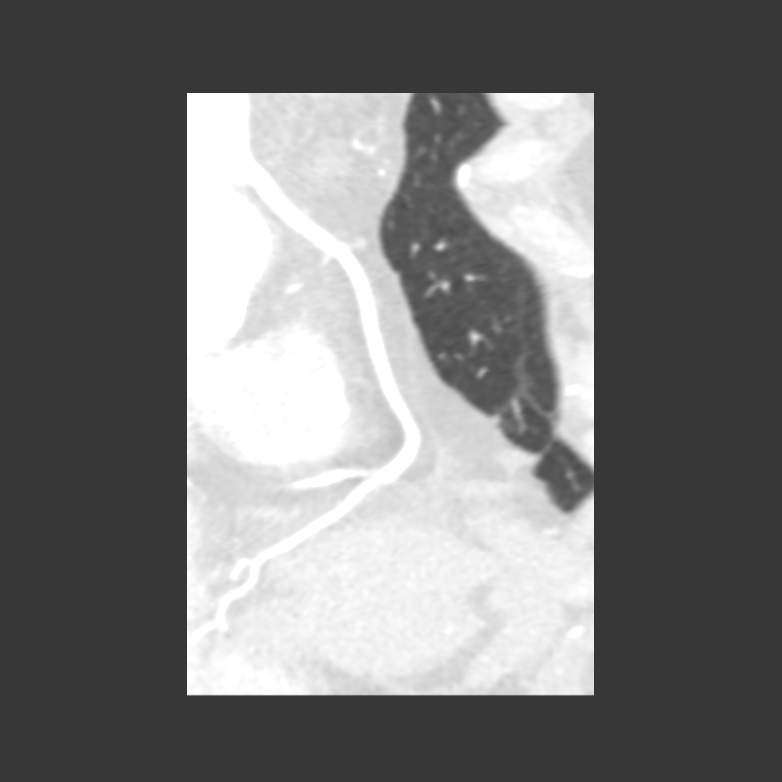
[im 3/9  vessel]
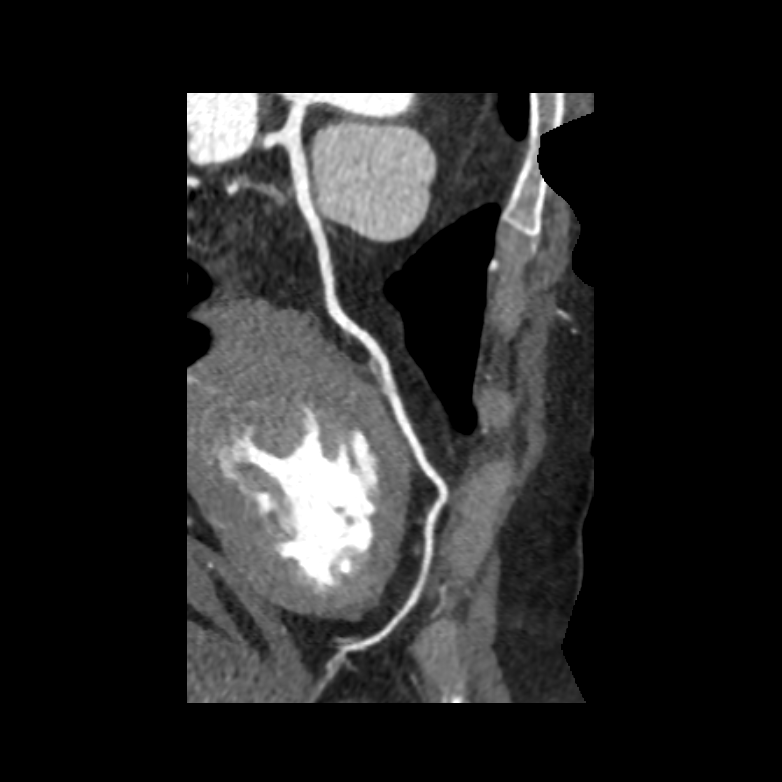
[im 4/9  vessel]
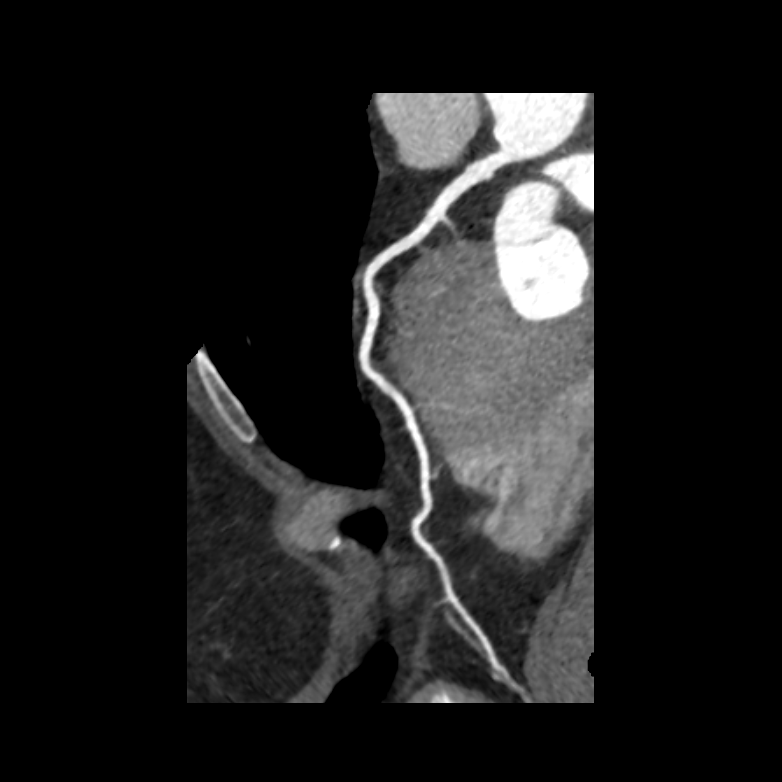
[im 6/9  vessel]
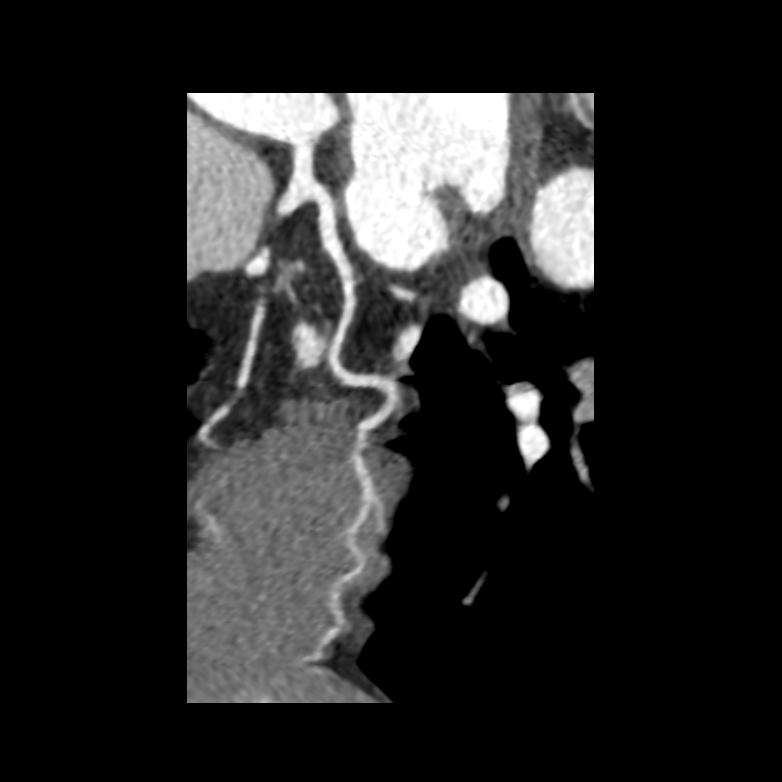
[im 7/9  vessel]
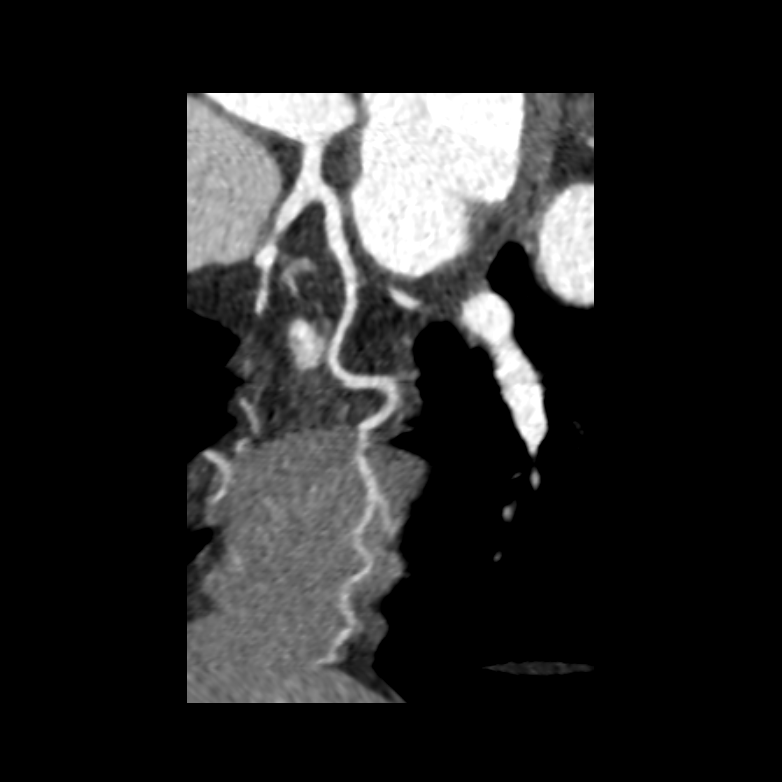
[im 7/9  lung]
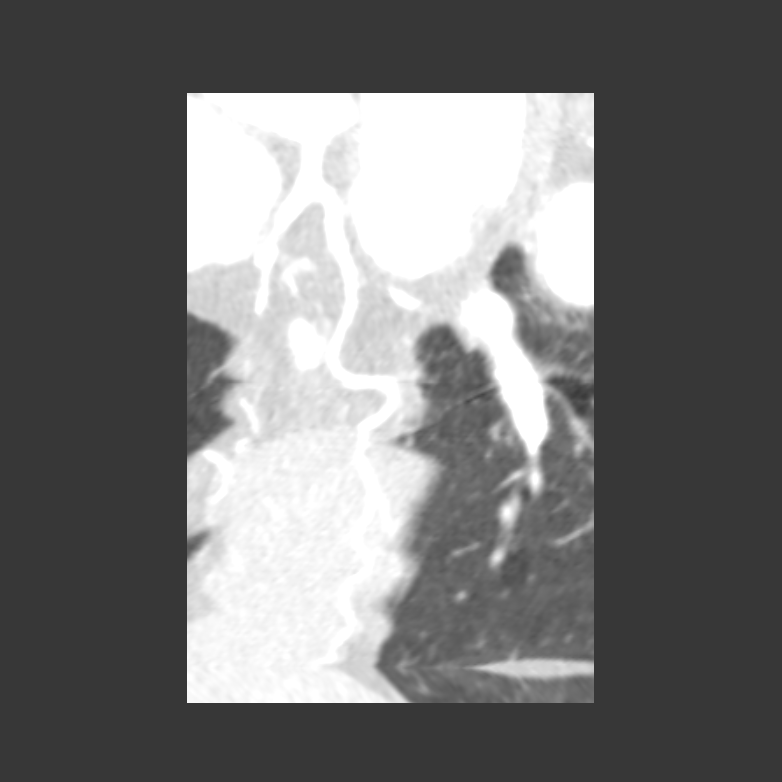
[im 8/9  vessel]
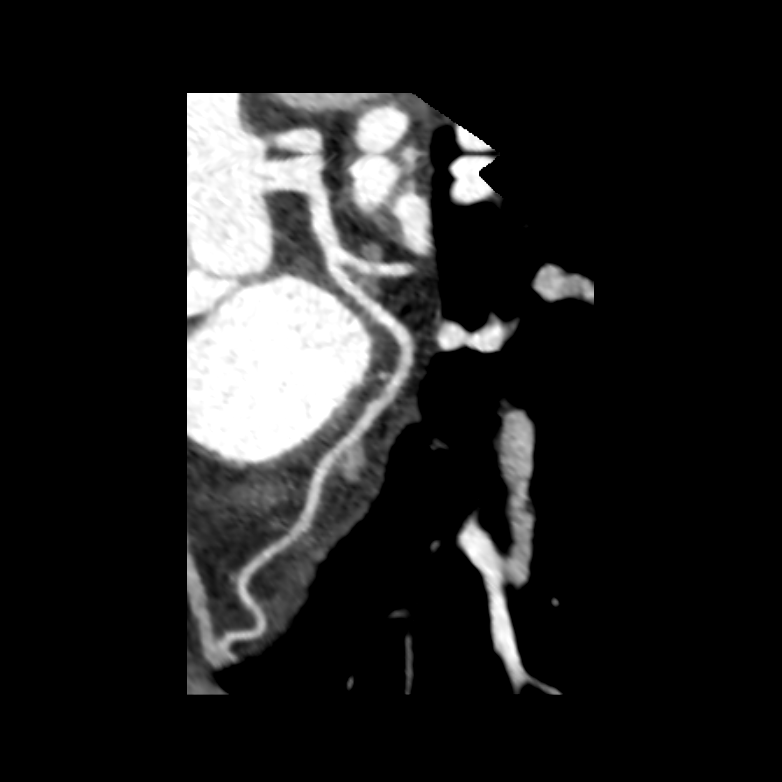

[6 of 9 positions shown; findings below may reference images not displayed]

FINDINGS: Within the visualized portions of the thorax there are no suspicious
appearing pulmonary nodules or masses, there is no acute
consolidative airspace disease, no pleural effusions, no
pneumothorax and no lymphadenopathy. Visualized portions of the
upper abdomen are unremarkable. There are no aggressive appearing
lytic or blastic lesions noted in the visualized portions of the
skeleton.
IMPRESSION: No significant incidental noncardiac findings are noted.
FINDINGS: A 100 kV prospective scan was triggered in the descending thoracic
aorta at 111 HU's. Axial non-contrast 3 mm slices were carried out
through the heart. The data set was analyzed on a dedicated work
station and scored using the Agatson method. Gantry rotation speed
was 250 msecs and collimation was .6 mm. No beta blockade and 0.8 mg
of sl NTG was given. The 3D data set was reconstructed in 5%
intervals of the 67-82 % of the R-R cycle. Diastolic phases were
analyzed on a dedicated work station using MPR, MIP and VRT modes.
The patient received 80 cc of contrast.

Aorta:  Normal size.  No calcifications.  No dissection.

Aortic Valve:  Tri-leaflet.  No calcifications.

Coronary Arteries:  Normal coronary origin.  Right dominance.

Coronary calcium score of 0. This was 1st percentile for age, sex,
and race matched control.

RCA is a large dominant artery that gives rise to PDA and PLA. There
is no plaque.

Left main is a large artery that gives rise to LAD and LCX arteries.

LAD is a large vessel that gives rise to a D1 vessel and has no
plaque.

LCX is a non-dominant artery that gives rise to one large OM1 branch
and bifurcates with a smaller OM2 branch. There is no plaque.

Other findings:

Abnormal pulmonary vein drainage into the left atrium (left common
pulmonary vein with normal RUPV and RLPV anatomy).

Normal left atrial appendage without a thrombus.

Normal size of the pulmonary artery.

Extra-cardiac findings: See attached radiology report for
non-cardiac structures.
IMPRESSION: 1. Coronary calcium score of 0. This was 1st percentile for age,
sex, and race matched control.

2. Normal coronary origin with right dominance.

3. CAD-RADS 0. No evidence of CAD (0%). Consider non-atherosclerotic
causes of chest pain.

4. Abnormal pulmonary vein drainage into the left atrium as above.

*** End of Addendum ***
EXAM:
OVER-READ INTERPRETATION  CT CHEST

The following report is an over-read performed by radiologist Dr.
Muruthi Buso [REDACTED] on 11/03/2020. This
over-read does not include interpretation of cardiac or coronary
anatomy or pathology. The coronary calcium score/coronary CTA
interpretation by the cardiologist is attached.
FINDINGS: Within the visualized portions of the thorax there are no suspicious
appearing pulmonary nodules or masses, there is no acute
consolidative airspace disease, no pleural effusions, no
pneumothorax and no lymphadenopathy. Visualized portions of the
upper abdomen are unremarkable. There are no aggressive appearing
lytic or blastic lesions noted in the visualized portions of the
skeleton.
IMPRESSION: No significant incidental noncardiac findings are noted.

## 2023-03-20 ENCOUNTER — Ambulatory Visit: Payer: BC Managed Care – PPO | Attending: Physician Assistant

## 2023-03-20 ENCOUNTER — Other Ambulatory Visit: Payer: Self-pay

## 2023-03-20 DIAGNOSIS — R262 Difficulty in walking, not elsewhere classified: Secondary | ICD-10-CM | POA: Insufficient documentation

## 2023-03-20 DIAGNOSIS — R29898 Other symptoms and signs involving the musculoskeletal system: Secondary | ICD-10-CM | POA: Insufficient documentation

## 2023-03-20 DIAGNOSIS — M21371 Foot drop, right foot: Secondary | ICD-10-CM | POA: Diagnosis present

## 2023-03-20 NOTE — Therapy (Signed)
OUTPATIENT PHYSICAL THERAPY LOWER EXTREMITY EVALUATION   Patient Name: Sarah Hunter MRN: [id number] DOB:Mar 02, 1966, 57 y.o., female Today's Date: 03/20/2023  END OF SESSION:  PT End of Session - 03/20/23 1431     Visit Number 1    Date for PT Re-Evaluation 06/12/23    PT Start Time 1430    PT Stop Time 1515    PT Time Calculation (min) 45 min    Activity Tolerance Patient tolerated treatment well    Behavior During Therapy [facility] for tasks assessed/performed             Past Medical History:  Diagnosis Date   Kidney stone    Past Surgical History:  Procedure Laterality Date   CESAREAN SECTION     Patient Active Problem List   Diagnosis Date Noted   Abnormal EKG    Essential hypertension 11/04/2020   Hyperlipidemia 11/04/2020   First degree AV block 11/04/2020   Chest pain 11/02/2020   Elevated blood pressure reading 11/02/2020   Tobacco use 11/02/2020    PCP: Aurelio Jew, PA  REFERRING PROVIDER: Aurelio Jew  REFERRING DIAG: R LE weakness  THERAPY DIAG:  Difficulty in walking, not elsewhere classified - Plan: PT plan of care cert/re-cert  Weakness of right leg - Plan: PT plan of care cert/re-cert  Foot drop, right - Plan: PT plan of care cert/re-cert  Rationale for Evaluation and Treatment: Rehabilitation  ONSET DATE: 2 years , 2022  SUBJECTIVE:   SUBJECTIVE STATEMENT: A couple of years of R  L weakness/ discoordination, R hip doesn't bend like it is supposed to. Hurts when I lie on my R side but if I pad my hip it doesn't hurt.  I have a mole on R lat hip that is changing  so I dont know if that is what hurts  PERTINENT HISTORY: Referred by PCP due to intermittent R LE weakness PAIN:  Are you having pain? Yes: NPRS scale: 3/10 Pain location: r lateral hip over gr trochanter Pain description: hurts when I lie on R side Aggravating factors: lying on R side Relieving factors: rest  PRECAUTIONS: None  WEIGHT BEARING RESTRICTIONS: No  FALLS:   Has patient fallen in last 6 months? 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 Mild tenderness R lateral trochanter femur   LOWER EXTREMITY ROM: all AAROM B hips wnl R ankle dorsiflexion restricted, to 90 degrees or neutral, compared to L at 15 degrees  LOWER EXTREMITY MMT:all wnl unless otherwise noted  MMT Right eval Left eval  Hip flexion    Hip extension    Hip abduction 4/5   Hip adduction    Hip internal rotation    Hip external rotation    Knee flexion    Knee extension    Ankle dorsiflexion 3-/5   Ankle plantarflexion 3+/5   Ankle inversion    Ankle eversion  Gr toe extension wnl B LOWER EXTREMITY SPECIAL TESTS:  Hip special tests: Saralyn Pilar (FABER) test: negative, SI compression test: negative, and thigh thrust - , SLR -, prone knee bend -    GAIT: Distance walked: 79' within clinic Assistive device utilized: None Level of assistance: Complete Independence Comments: R knee recurvatum noted, with R foot drop, slight circumduction R LE    TODAY'S TREATMENT:                                                                                                                              DATE: 03/20/23 Eval, instructed in: R ankle plantarflexor stretch with strap  R ankle plantarflexor strengthening with black t band ankle  pumps   PATIENT EDUCATION:  Education details: POC, goals Person educated: Patient Education method: Explanation, Demonstration, Tactile cues, and Verbal cues Education comprehension: verbalized understanding and returned demonstration  HOME EXERCISE PROGRAM: Access Code: [email or web address] URL: [email or web address] Date: 03/20/2023 Prepared by: Simran Mannis  Exercises - Long Sitting Calf Stretch with Strap  - 1 x daily - 7 x weekly - 3 sets - 10 reps - Long Sitting Ankle Plantar Flexion with Resistance  - 1 x daily - 7 x weekly - 3 sets - 10 reps  ASSESSMENT:  CLINICAL IMPRESSION: Patient is a 57 y.o. female who was seen today for physical therapy evaluation and treatment for R LE weakness. She does demonstrate weakness R ankle dorsiflexors and plantarflexors with R foot drop and resultant genu recurvatum and circumducting gait pattern. She has no pain lower back or R LE with movement or palpation. She is to see the neurologist next week.  Recommended tthat due to the insidious onset, and inability to reproduce any other Sx at PT eval, that she go through neurology evaluation and then return to physical therapy as recommended. Will hold further PT appts at this time pending her neurology evaluation  OBJECTIVE IMPAIRMENTS: Abnormal gait, decreased balance, decreased coordination, decreased endurance, difficulty walking, and decreased strength.   ACTIVITY LIMITATIONS: stairs and locomotion level  PARTICIPATION LIMITATIONS: shopping, community activity, and yard work  PERSONAL FACTORS: Time since onset of injury/illness/exacerbation and 1 comorbidity: smoker  are also affecting patient's functional outcome.   REHAB POTENTIAL: Good  CLINICAL DECISION MAKING: Stable/uncomplicated  EVALUATION COMPLEXITY: Low   GOALS: Goals reviewed with patient? Yes  SHORT TERM GOALS: Target date: 03/20/23 Establish and be able to follow with home exercise program Baseline:established  today Goal status: INITIAL   LONG TERM GOALS: Target date: will establish long term goal at later date if returns after neurology evaluation  PLAN:  PT FREQUENCY: one time visit, hold pending neurology results  PT DURATION: other: will establish if returns post neurology eval  PLANNED INTERVENTIONS: Therapeutic exercises, Therapeutic activity, Neuromuscular re-education, Balance training, Gait training, Patient/Family education, Self Care, and Joint mobilization  PLAN FOR NEXT SESSION: await eval at neurologist and resume if ordered   Michaelina Blandino L Destine Zirkle, PT 03/20/2023, 5:43 PM
